# Patient Record
Sex: Female | Born: 1985 | Race: Black or African American | Hispanic: Refuse to answer | Smoking: Former smoker
Health system: Southern US, Community
[De-identification: ages and names within clinical notes are randomized; demographics above are authoritative.]

## PROBLEM LIST (undated history)

## (undated) DIAGNOSIS — R202 Paresthesia of skin: Secondary | ICD-10-CM

## (undated) DIAGNOSIS — G9050 Complex regional pain syndrome I, unspecified: Secondary | ICD-10-CM

## (undated) DIAGNOSIS — B2790 Infectious mononucleosis, unspecified without complication: Secondary | ICD-10-CM

## (undated) DIAGNOSIS — L52 Erythema nodosum: Secondary | ICD-10-CM

## (undated) DIAGNOSIS — F32A Depression, unspecified: Secondary | ICD-10-CM

## (undated) DIAGNOSIS — E049 Nontoxic goiter, unspecified: Secondary | ICD-10-CM

## (undated) DIAGNOSIS — J302 Other seasonal allergic rhinitis: Secondary | ICD-10-CM

## (undated) DIAGNOSIS — F329 Major depressive disorder, single episode, unspecified: Secondary | ICD-10-CM

## (undated) DIAGNOSIS — F419 Anxiety disorder, unspecified: Secondary | ICD-10-CM

## (undated) DIAGNOSIS — S92909A Unspecified fracture of unspecified foot, initial encounter for closed fracture: Secondary | ICD-10-CM

## (undated) DIAGNOSIS — K649 Unspecified hemorrhoids: Secondary | ICD-10-CM

## (undated) DIAGNOSIS — E7800 Pure hypercholesterolemia, unspecified: Secondary | ICD-10-CM

## (undated) DIAGNOSIS — G43909 Migraine, unspecified, not intractable, without status migrainosus: Secondary | ICD-10-CM

## (undated) DIAGNOSIS — N809 Endometriosis, unspecified: Secondary | ICD-10-CM

## (undated) DIAGNOSIS — R400 Somnolence: Secondary | ICD-10-CM

## (undated) DIAGNOSIS — D649 Anemia, unspecified: Secondary | ICD-10-CM

## (undated) DIAGNOSIS — K2970 Gastritis, unspecified, without bleeding: Secondary | ICD-10-CM

## (undated) HISTORY — DX: Complex regional pain syndrome I, unspecified: G90.50

## (undated) HISTORY — DX: Nontoxic goiter, unspecified: E04.9

## (undated) HISTORY — DX: Unspecified hemorrhoids: K64.9

## (undated) HISTORY — PX: KNEE SURGERY: SHX244

## (undated) HISTORY — PX: WISDOM TOOTH EXTRACTION: SHX21

## (undated) HISTORY — PX: CRYOTHERAPY: SHX1416

## (undated) HISTORY — DX: Gastritis, unspecified, without bleeding: K29.70

## (undated) HISTORY — PX: MOUTH SURGERY: SHX715

## (undated) HISTORY — DX: Paresthesia of skin: R20.2

## (undated) HISTORY — DX: Pure hypercholesterolemia, unspecified: E78.00

## (undated) HISTORY — DX: Endometriosis, unspecified: N80.9

## (undated) HISTORY — DX: Somnolence: R40.0

## (undated) HISTORY — DX: Migraine, unspecified, not intractable, without status migrainosus: G43.909

---
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
## 2007-06-26 ENCOUNTER — Encounter: Payer: Self-pay | Admitting: Pulmonary Disease

## 2007-12-12 ENCOUNTER — Encounter: Payer: Self-pay | Admitting: Pulmonary Disease

## 2008-05-08 ENCOUNTER — Encounter: Payer: Self-pay | Admitting: Pulmonary Disease

## 2008-09-19 ENCOUNTER — Encounter: Payer: Self-pay | Admitting: Pulmonary Disease

## 2009-04-30 ENCOUNTER — Ambulatory Visit: Payer: Self-pay | Admitting: Pulmonary Disease

## 2009-04-30 DIAGNOSIS — J45909 Unspecified asthma, uncomplicated: Secondary | ICD-10-CM | POA: Insufficient documentation

## 2009-05-01 DIAGNOSIS — L52 Erythema nodosum: Secondary | ICD-10-CM

## 2009-05-07 ENCOUNTER — Ambulatory Visit: Payer: Self-pay | Admitting: Internal Medicine

## 2010-09-04 ENCOUNTER — Emergency Department (HOSPITAL_COMMUNITY): Admission: EM | Admit: 2010-09-04 | Discharge: 2010-09-04 | Payer: Self-pay | Admitting: Family Medicine

## 2011-08-20 ENCOUNTER — Inpatient Hospital Stay (HOSPITAL_COMMUNITY)
Admission: AD | Admit: 2011-08-20 | Discharge: 2011-08-20 | Disposition: A | Discharge status: Home / Self Care | Payer: 59 | Source: Ambulatory Visit | Attending: Obstetrics and Gynecology | Admitting: Obstetrics and Gynecology

## 2011-08-20 ENCOUNTER — Encounter (HOSPITAL_COMMUNITY): Payer: Self-pay | Admitting: *Deleted

## 2011-08-20 ENCOUNTER — Inpatient Hospital Stay (HOSPITAL_COMMUNITY): Payer: 59

## 2011-08-20 DIAGNOSIS — N938 Other specified abnormal uterine and vaginal bleeding: Secondary | ICD-10-CM | POA: Insufficient documentation

## 2011-08-20 DIAGNOSIS — R1032 Left lower quadrant pain: Secondary | ICD-10-CM | POA: Insufficient documentation

## 2011-08-20 DIAGNOSIS — N939 Abnormal uterine and vaginal bleeding, unspecified: Secondary | ICD-10-CM | POA: Diagnosis present

## 2011-08-20 DIAGNOSIS — N898 Other specified noninflammatory disorders of vagina: Secondary | ICD-10-CM

## 2011-08-20 DIAGNOSIS — R102 Pelvic and perineal pain: Secondary | ICD-10-CM | POA: Diagnosis present

## 2011-08-20 DIAGNOSIS — N949 Unspecified condition associated with female genital organs and menstrual cycle: Secondary | ICD-10-CM | POA: Insufficient documentation

## 2011-08-20 HISTORY — DX: Other seasonal allergic rhinitis: J30.2

## 2011-08-20 HISTORY — DX: Anemia, unspecified: D64.9

## 2011-08-20 HISTORY — DX: Erythema nodosum: L52

## 2011-08-20 LAB — URINALYSIS, ROUTINE W REFLEX MICROSCOPIC
Bilirubin Urine: NEGATIVE
Ketones, ur: NEGATIVE mg/dL
Nitrite: NEGATIVE
pH: 7 (ref 5.0–8.0)

## 2011-08-20 LAB — URINE MICROSCOPIC-ADD ON

## 2011-08-20 MED ORDER — KETOROLAC TROMETHAMINE 60 MG/2ML IM SOLN
60.0000 mg | Freq: Once | INTRAMUSCULAR | Status: AC
  Administered 2011-08-20: 60 mg via INTRAMUSCULAR
  Filled 2011-08-20: qty 2

## 2011-08-20 MED ORDER — IBUPROFEN 200 MG PO TABS: 800.0000 mg | ORAL_TABLET | Freq: Three times a day (TID) | ORAL | Status: DC | PRN

## 2011-08-20 NOTE — ED Provider Notes (Signed)
History     Chief Complaint  Patient presents with  . Vaginal Bleeding  . Abdominal Cramping  . Ovarian Cyst   HPI "Heavy" vaginal bleeding since yesterday. LLQ cramping. Pain in intercourse on 9/11. Seen in office yesterday for this complaint, had u/s, was told that it was probably an ovarian cyst. Pain is worsening.   OB History    Grav Para Term Preterm Abortions TAB SAB Ect Mult Living   0               Past Medical History  Diagnosis Date  . Asthma   . Seasonal allergic rhinitis   . Anemia   . Erythema nodosum     on legs    Past Surgical History  Procedure Date  . Knee surgery   . Mouth surgery     Family History  Problem Relation Age of Onset  . Hypertension Mother   . Thyroid disease Mother     History  Substance Use Topics  . Smoking status: Former Smoker    Quit date: 12/07/2003  . Smokeless tobacco: Not on file  . Alcohol Use: Yes     pint a week on the weekends    Allergies: No Known Allergies  Prescriptions prior to admission  Medication Sig Dispense Refill  . Chlorpheniramine Maleate (ALLERGY PO) Take 1 tablet by mouth daily.        Marland Kitchen HYDROcodone-acetaminophen (VICODIN) 5-500 MG per tablet Take 1 tablet by mouth every 6 (six) hours as needed. For pain       . DISCONTD: ibuprofen (ADVIL,MOTRIN) 200 MG tablet Take 600 mg by mouth every 6 (six) hours as needed. For pain         Review of Systems  Constitutional: Negative.   Respiratory: Negative.   Cardiovascular: Negative.   Gastrointestinal: Positive for abdominal pain. Negative for nausea, vomiting, diarrhea and constipation.       + pain with BM  Genitourinary: Negative for dysuria, urgency, frequency, hematuria and flank pain.       Positive for vaginal bleeding and cramping   Musculoskeletal: Negative.   Neurological: Negative.   Psychiatric/Behavioral: Negative.    Physical Exam   Blood pressure 117/81, pulse 83, temperature 98.2 F (36.8 C), temperature source Oral, resp.  rate 16, height 5\' 7"  (1.702 m), weight 80.105 kg (176 lb 9.6 oz), last menstrual period 07/26/2011.  Physical Exam  Constitutional: She is oriented to person, place, and time. She appears well-developed and well-nourished. No distress.  HENT:  Head: Normocephalic and atraumatic.  Cardiovascular: Normal rate, regular rhythm and normal heart sounds.   Respiratory: Effort normal and breath sounds normal. No respiratory distress.  GI: Soft. Bowel sounds are normal. She exhibits no distension and no mass. There is no tenderness. There is no rebound and no guarding.  Genitourinary: There is no rash or lesion on the right labia. There is no rash or lesion on the left labia. Uterus is not deviated, not enlarged, not fixed and not tender. Cervix exhibits motion tenderness. Cervix exhibits no discharge and no friability. Right adnexum displays no mass, no tenderness and no fullness. Left adnexum displays tenderness. Left adnexum displays no mass and no fullness. There is bleeding (moderate) around the vagina. No erythema or tenderness around the vagina. No vaginal discharge found.  Neurological: She is alert and oriented to person, place, and time.  Skin: Skin is warm and dry.  Psychiatric: She has a normal mood and affect.    MAU Course  Procedures Pelvic u/s WNL, no adnexal mass, trace free fluid  Results for orders placed during the hospital encounter of 08/20/11 (from the past 24 hour(s))  URINALYSIS, ROUTINE W REFLEX MICROSCOPIC     Status: Abnormal   Collection Time   08/20/11  5:05 AM      Component Value Range   Color, Urine YELLOW  YELLOW    Appearance CLEAR  CLEAR    Specific Gravity, Urine 1.010  1.005 - 1.030    pH 7.0  5.0 - 8.0    Glucose, UA NEGATIVE  NEGATIVE (mg/dL)   Hgb urine dipstick LARGE (*) NEGATIVE    Bilirubin Urine NEGATIVE  NEGATIVE    Ketones, ur NEGATIVE  NEGATIVE (mg/dL)   Protein, ur NEGATIVE  NEGATIVE (mg/dL)   Urobilinogen, UA 0.2  0.0 - 1.0 (mg/dL)   Nitrite  NEGATIVE  NEGATIVE    Leukocytes, UA NEGATIVE  NEGATIVE   URINE MICROSCOPIC-ADD ON     Status: Abnormal   Collection Time   08/20/11  5:05 AM      Component Value Range   Squamous Epithelial / LPF RARE  RARE    WBC, UA 0-2  <3 (WBC/hpf)   RBC / HPF 21-50  <3 (RBC/hpf)   Bacteria, UA FEW (*) RARE   POCT PREGNANCY, URINE     Status: Normal   Collection Time   08/20/11  5:16 AM      Component Value Range   Preg Test, Ur NEGATIVE       Assessment and Plan  Pelvic pain and vaginal bleeding Ibuprofen for pain F/U in office next week as scheduled Dr. Neva Seat consulted, agrees with plan Lillie Portner 08/20/2011, 7:04 AM

## 2011-08-20 NOTE — Progress Notes (Signed)
Pt having vag bleeding and LLQ cramping.  LMP 07/26/2011.  Pt seen in the office 9/13.

## 2012-02-01 ENCOUNTER — Other Ambulatory Visit (HOSPITAL_COMMUNITY)
Admission: RE | Admit: 2012-02-01 | Discharge: 2012-02-01 | Disposition: A | Discharge status: Home / Self Care | Payer: 59 | Source: Ambulatory Visit | Attending: Obstetrics and Gynecology | Admitting: Obstetrics and Gynecology

## 2012-02-01 DIAGNOSIS — Z124 Encounter for screening for malignant neoplasm of cervix: Secondary | ICD-10-CM | POA: Insufficient documentation

## 2012-02-01 DIAGNOSIS — Z113 Encounter for screening for infections with a predominantly sexual mode of transmission: Secondary | ICD-10-CM | POA: Insufficient documentation

## 2012-11-12 IMAGING — US US PELVIS COMPLETE
1 series · 13 of 25 positions shown · non-contrast
Comparison: None.

CLINICAL DATA: Vaginal bleeding and left adnexal pain.  Cramping.

TRANSABDOMINAL AND TRANSVAGINAL ULTRASOUND OF PELVIS
TECHNIQUE: Both transabdominal and transvaginal ultrasound
examinations of the pelvis were performed. Transabdominal technique
was performed for global imaging of the pelvis including uterus,
ovaries, adnexal regions, and pelvic cul-de-sac.

[Series 1: us pelvis complete · 13 of 41 slices shown]
[im 1/41]
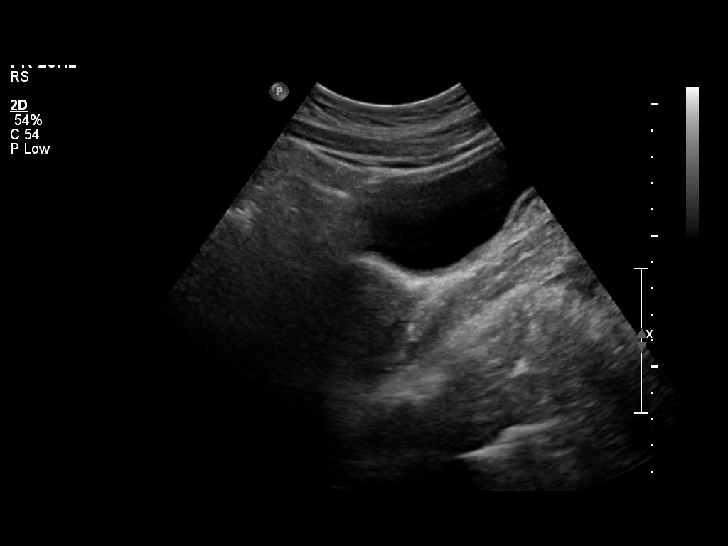
[im 4/41]
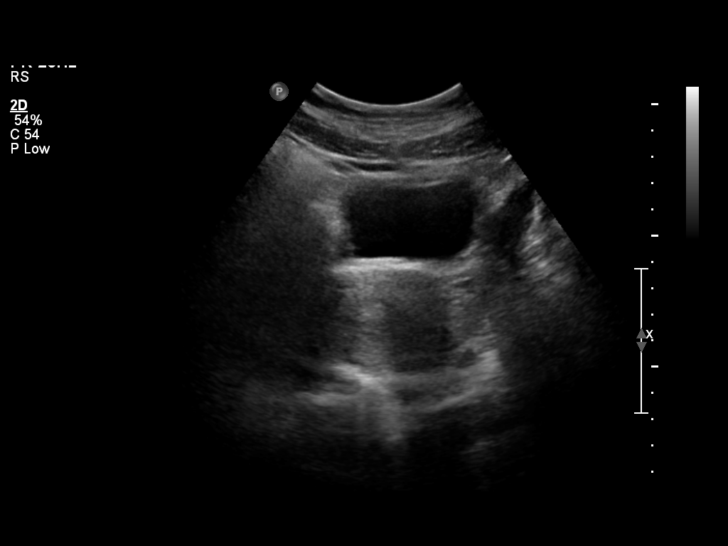
[im 7/41]
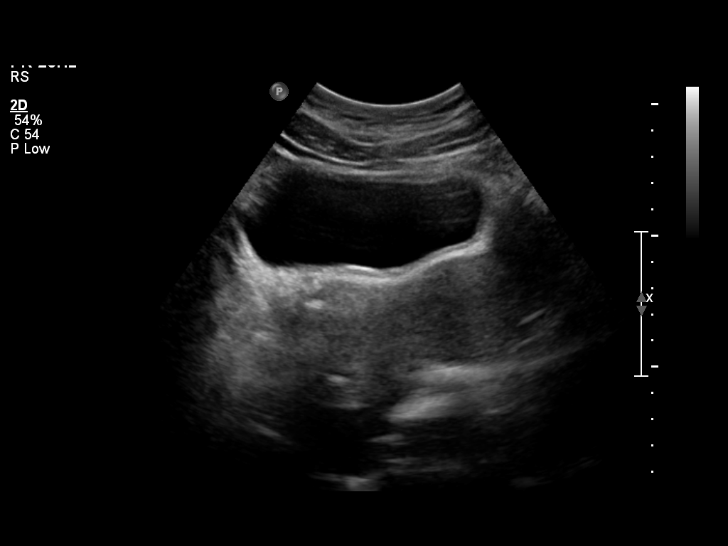
[im 11/41]
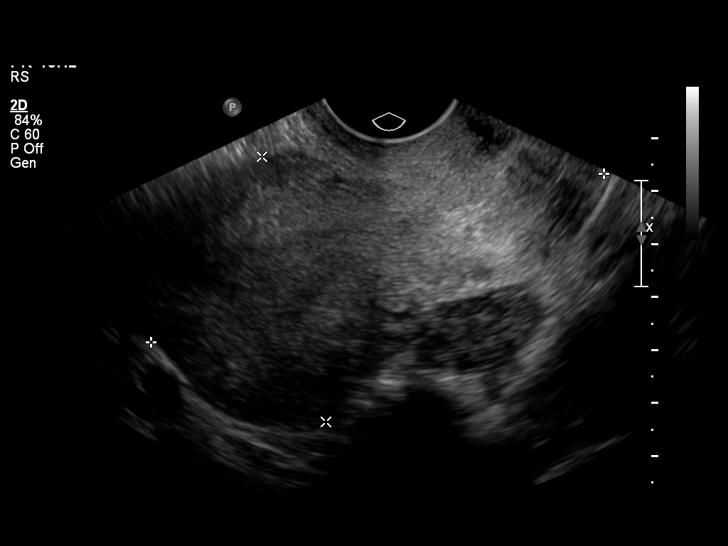
[im 14/41]
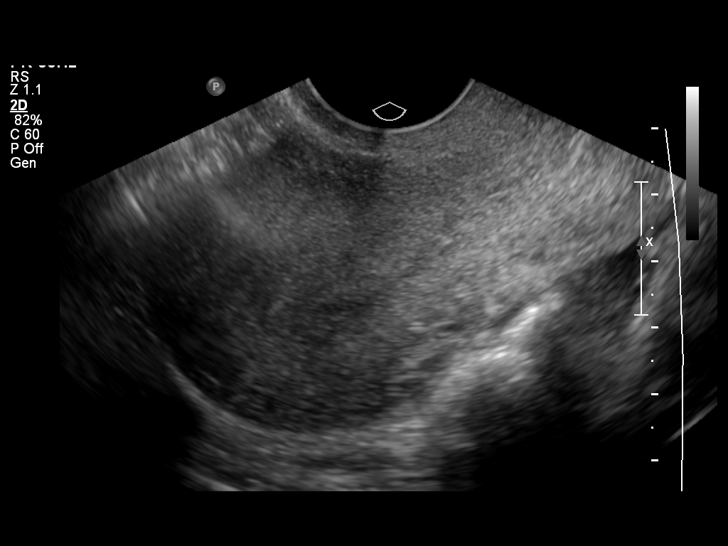
[im 17/41]
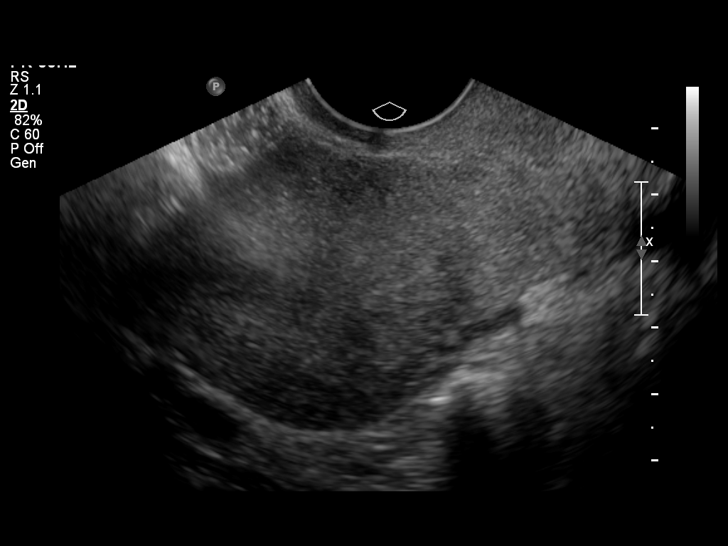
[im 21/41]
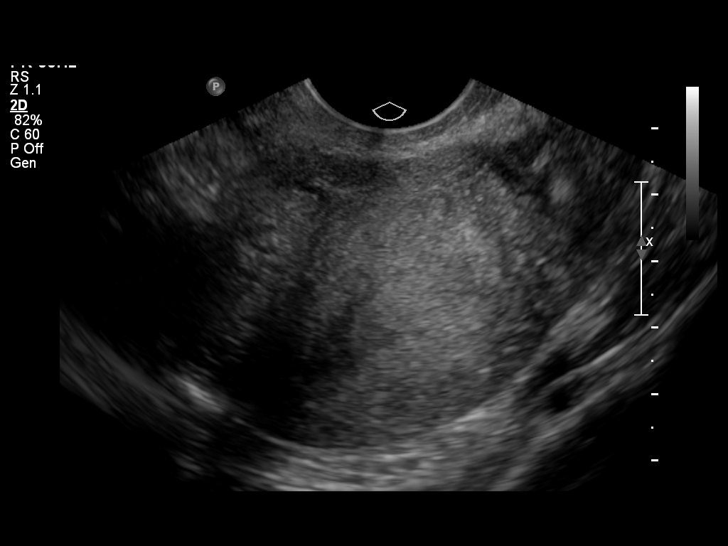
[im 24/41]
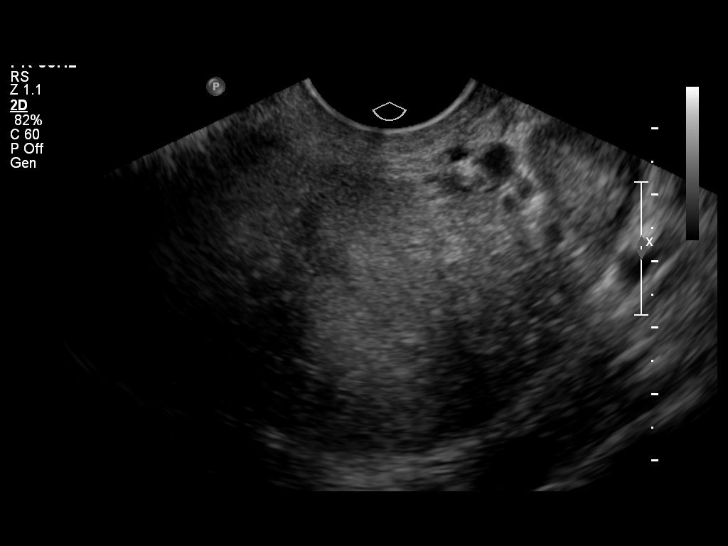
[im 27/41]
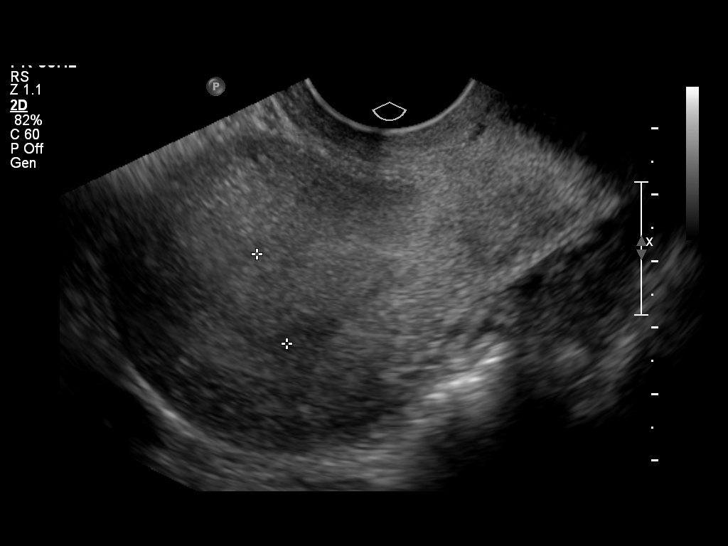
[im 31/41]
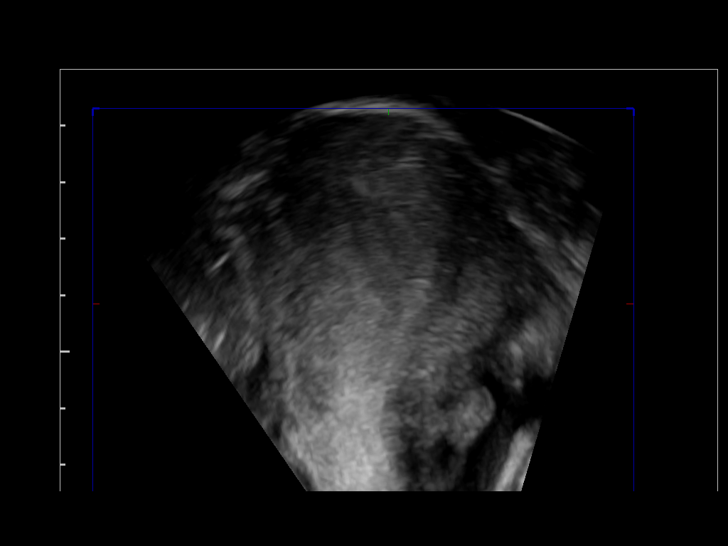
[im 34/41]
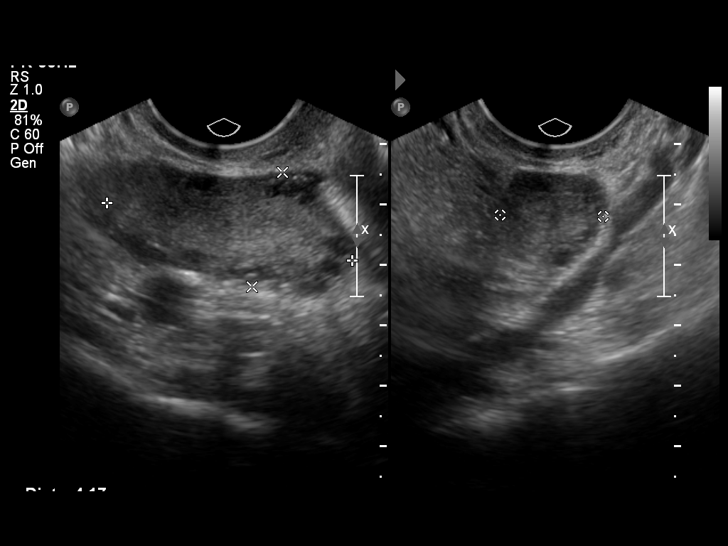
[im 37/41]
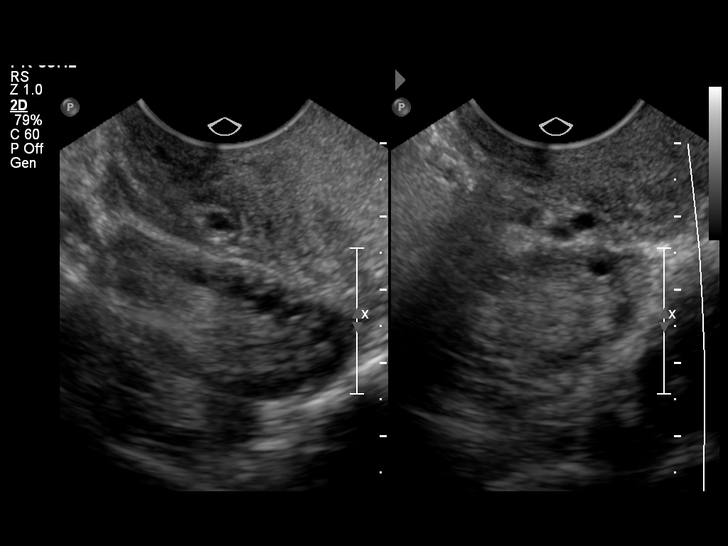
[im 41/41]
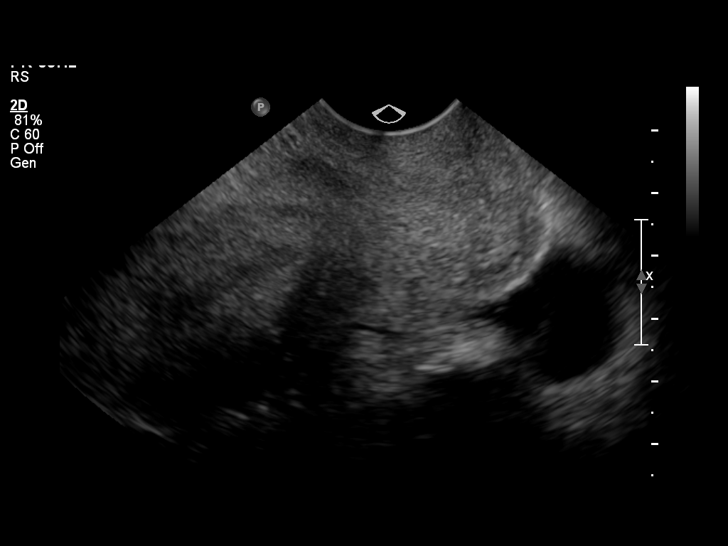

[13 of 25 positions shown; findings below may reference images not displayed]

It was necessary to proceed with endovaginal exam following the
transabdominal exam to visualize the ovaries.
FINDINGS: Uterus: Normal in size and appearance; measures 9.1 cm in length,
5.2 cm in AP dimension and 5.6 cm in transverse dimension.

Endometrium: Measures 1.5 cm in thickness, likely reflecting the
secretory phase.

Right ovary:  Normal appearance/no adnexal mass; measures 3.7 x
x 1.9 cm.  Limited color Doppler evaluation demonstrates normal
color Doppler blood flow with respect to the right ovary.

Left ovary: Normal appearance/no adnexal mass; measures 4.2 x 2.0 x
1.7 cm.  Limited color Doppler evaluation demonstrates normal color
Doppler blood flow with respect to the left ovary.

Other findings: A small amount of free fluid is noted within the
pelvic cul-de-sac, likely physiologic in nature.
IMPRESSION: Normal study. No evidence of pelvic mass or other significant
abnormality.

## 2013-05-23 ENCOUNTER — Other Ambulatory Visit: Payer: Self-pay | Admitting: Family Medicine

## 2013-05-23 DIAGNOSIS — M79671 Pain in right foot: Secondary | ICD-10-CM

## 2013-06-06 ENCOUNTER — Other Ambulatory Visit: Payer: 59

## 2013-06-13 ENCOUNTER — Other Ambulatory Visit: Payer: 59

## 2013-08-05 ENCOUNTER — Encounter (HOSPITAL_BASED_OUTPATIENT_CLINIC_OR_DEPARTMENT_OTHER): Payer: Self-pay | Admitting: Emergency Medicine

## 2013-08-05 ENCOUNTER — Emergency Department (HOSPITAL_BASED_OUTPATIENT_CLINIC_OR_DEPARTMENT_OTHER)
Admission: EM | Admit: 2013-08-05 | Discharge: 2013-08-05 | Disposition: A | Discharge status: Home / Self Care | Payer: 59 | Attending: Emergency Medicine | Admitting: Emergency Medicine

## 2013-08-05 DIAGNOSIS — L0501 Pilonidal cyst with abscess: Secondary | ICD-10-CM | POA: Insufficient documentation

## 2013-08-05 DIAGNOSIS — Z8781 Personal history of (healed) traumatic fracture: Secondary | ICD-10-CM | POA: Insufficient documentation

## 2013-08-05 DIAGNOSIS — J45909 Unspecified asthma, uncomplicated: Secondary | ICD-10-CM | POA: Insufficient documentation

## 2013-08-05 DIAGNOSIS — Z8709 Personal history of other diseases of the respiratory system: Secondary | ICD-10-CM | POA: Insufficient documentation

## 2013-08-05 DIAGNOSIS — Z862 Personal history of diseases of the blood and blood-forming organs and certain disorders involving the immune mechanism: Secondary | ICD-10-CM | POA: Insufficient documentation

## 2013-08-05 DIAGNOSIS — Z872 Personal history of diseases of the skin and subcutaneous tissue: Secondary | ICD-10-CM | POA: Insufficient documentation

## 2013-08-05 DIAGNOSIS — Z87891 Personal history of nicotine dependence: Secondary | ICD-10-CM | POA: Insufficient documentation

## 2013-08-05 DIAGNOSIS — Z79899 Other long term (current) drug therapy: Secondary | ICD-10-CM | POA: Insufficient documentation

## 2013-08-05 HISTORY — DX: Unspecified fracture of unspecified foot, initial encounter for closed fracture: S92.909A

## 2013-08-05 MED ORDER — ONDANSETRON 4 MG PO TBDP
4.0000 mg | ORAL_TABLET | Freq: Once | ORAL | Status: AC
  Administered 2013-08-05: 4 mg via ORAL

## 2013-08-05 MED ORDER — ONDANSETRON 4 MG PO TBDP
ORAL_TABLET | ORAL | Status: AC
  Administered 2013-08-05: 4 mg via ORAL
  Filled 2013-08-05: qty 1

## 2013-08-05 MED ORDER — OXYCODONE-ACETAMINOPHEN 5-325 MG PO TABS
2.0000 | ORAL_TABLET | Freq: Once | ORAL | Status: AC
  Administered 2013-08-05: 2 via ORAL
  Filled 2013-08-05 (×2): qty 2

## 2013-08-05 MED ORDER — OXYCODONE-ACETAMINOPHEN 5-325 MG PO TABS: 1.0000 | ORAL_TABLET | ORAL | Status: DC | PRN

## 2013-08-05 MED ORDER — SULFAMETHOXAZOLE-TRIMETHOPRIM 800-160 MG PO TABS: 1.0000 | ORAL_TABLET | Freq: Two times a day (BID) | ORAL | Status: DC

## 2013-08-05 NOTE — ED Notes (Signed)
D/c home with ride- rx x 2 given for percocet and septra- pt states she feels better, nausea is decreased

## 2013-08-05 NOTE — ED Provider Notes (Signed)
This chart was scribed for Layla Maw Sanvi Ehler, DO by Leone Payor, ED Scribe. This patient was seen in room MH06/[facility]06 and the patient's care was started 5:36 PM.  TIME SEEN: 5:36 PM   CHIEF COMPLAINT: back pain  HPI: HPI Comments: Brenda Mitchell is a 27 y.o. female who presents to the Emergency Department complaining of ongoing, constant, gradually worsening lower back pain starting 1 week ago. Pt states she has felt a mass above the tail bone that is very painful to touch. She denies a history of Crohn's disease or ulcerative colitis. No pain with bowel movements. No history of prior abscess.  She denies fever, vomiting, diarrhea.    ROS: See HPI Constitutional: no fever  Eyes: no drainage  ENT: no runny nose   Cardiovascular:  no chest pain  Resp: no SOB  GI: no vomiting, diarrhea GU: no dysuria Integumentary: no rash  Allergy: no hives  Musculoskeletal: no leg swelling  Neurological: no slurred speech ROS otherwise negative  PAST MEDICAL HISTORY/PAST SURGICAL HISTORY:  Past Medical History  Diagnosis Date  . Asthma   . Seasonal allergic rhinitis   . Anemia   . Erythema nodosum     on legs  . Fracture of foot, closed     MEDICATIONS:  Prior to Admission medications   Medication Sig Start Date End Date Taking? Authorizing Provider  Chlorpheniramine Maleate (ALLERGY PO) Take 1 tablet by mouth daily.      Historical Provider, MD  HYDROcodone-acetaminophen (VICODIN) 5-500 MG per tablet Take 1 tablet by mouth every 6 (six) hours as needed. For pain     Historical Provider, MD  ibuprofen (ADVIL,MOTRIN) 200 MG tablet Take 4 tablets (800 mg total) by mouth every 8 (eight) hours as needed for pain. For pain 08/20/11   Archie Patten, CNM    ALLERGIES:  No Known Allergies  SOCIAL HISTORY:  History  Substance Use Topics  . Smoking status: Former Smoker    Quit date: 12/07/2003  . Smokeless tobacco: Not on file  . Alcohol Use: Yes     Comment: pint a week on the weekends     FAMILY HISTORY: Family History  Problem Relation Age of Onset  . Hypertension Mother   . Thyroid disease Mother     EXAM: BP 121/65  Pulse 96  Temp(Src) 98.4 F (36.9 C)  Resp 16  SpO2 100% CONSTITUTIONAL: Alert and oriented and responds appropriately to questions. Well-appearing; well-nourished HEAD: Normocephalic EYES: Conjunctivae clear, PERRL ENT: normal nose; no rhinorrhea; moist mucous membranes; pharynx without lesions noted NECK: Supple, no meningismus, no LAD  CARD: RRR; S1 and S2 appreciated; no murmurs, no clicks, no rubs, no gallops RESP: Normal chest excursion without splinting or tachypnea; breath sounds clear and equal bilaterally; no wheezes, no rhonchi, no rales,  ABD/GI: Normal bowel sounds; non-distended; soft, non-tender, no rebound, no guarding BACK:  The back appears normal and is non-tender to palpation, there is no CVA tenderness EXT: Normal ROM in all joints; non-tender to palpation; no edema; normal capillary refill; no cyanosis    SKIN: Normal color for age and race; warm. 5x3 area at the top of glueal cleft. Surrounding erythema and induration with central fluctuant portion with pinpoint area of purulent drainage on the center. NEURO: Moves all extremities equally PSYCH: The patient's mood and manner are appropriate. Grooming and personal hygiene are appropriate.  MEDICAL DECISION MAKING: Pt present with pilonidal cyst. Will perform I&D. She is well appearing with no systemic symptoms. Will  prescribe pain medication and antibiotics. Advised to follow up in 48 hours for reevaluation. Given customary in usual return precautions. She has PCP for followup. Patient verbalizes understanding is comfortable with plan. .    7:30 PM  INCISION AND DRAINAGE Performed by: Zettie Cooley, DO Consent: Verbal consent obtained. Risks and benefits: risks, benefits and alternatives were discussed Type: abscess  Body area: buttocks  Anesthesia: local  infiltration  Incision was made with a scalpel.  Local anesthetic: lidocaine 1% with epinephrine  Anesthetic total: 10 ml  Complexity: complex Blunt dissection to break up loculations  Drainage: purulent  Drainage amount: 10 Ml  Packing material: 1/4 in iodoform gauze - 8 cm used   Patient tolerance: Patient tolerated the procedure well with no immediate complications.     I personally performed the services described in this documentation, which was scribed in my presence. The recorded information has been reviewed and is accurate.  Layla Maw Dreyah Montrose, DO 08/06/13 [phone]

## 2013-08-05 NOTE — ED Notes (Signed)
States back pain x 1 week. 2 days ago noticed lump to top of tail bone area. No drainage noted, but painful to touch. Taking ibuprofen OTC, with mild relief. Pain 8/10. Has some tingling in bilateral legs after standing for a while.

## 2013-08-05 NOTE — ED Notes (Signed)
Lower back pain x 1 week, 2 days ago feels lump above tail bone that is painful

## 2013-08-05 NOTE — ED Notes (Signed)
Pt continues to c/o nausea after zofran administered- given crackers and gingerale

## 2013-08-07 ENCOUNTER — Emergency Department (HOSPITAL_BASED_OUTPATIENT_CLINIC_OR_DEPARTMENT_OTHER)
Admission: EM | Admit: 2013-08-07 | Discharge: 2013-08-07 | Disposition: A | Discharge status: Home / Self Care | Payer: 59 | Attending: Emergency Medicine | Admitting: Emergency Medicine

## 2013-08-07 ENCOUNTER — Encounter (HOSPITAL_BASED_OUTPATIENT_CLINIC_OR_DEPARTMENT_OTHER): Payer: Self-pay

## 2013-08-07 DIAGNOSIS — Z4801 Encounter for change or removal of surgical wound dressing: Secondary | ICD-10-CM | POA: Insufficient documentation

## 2013-08-07 DIAGNOSIS — Z5189 Encounter for other specified aftercare: Secondary | ICD-10-CM

## 2013-08-07 DIAGNOSIS — Z8781 Personal history of (healed) traumatic fracture: Secondary | ICD-10-CM | POA: Insufficient documentation

## 2013-08-07 DIAGNOSIS — Z79899 Other long term (current) drug therapy: Secondary | ICD-10-CM | POA: Insufficient documentation

## 2013-08-07 DIAGNOSIS — Z862 Personal history of diseases of the blood and blood-forming organs and certain disorders involving the immune mechanism: Secondary | ICD-10-CM | POA: Insufficient documentation

## 2013-08-07 DIAGNOSIS — Z87891 Personal history of nicotine dependence: Secondary | ICD-10-CM | POA: Insufficient documentation

## 2013-08-07 DIAGNOSIS — Z872 Personal history of diseases of the skin and subcutaneous tissue: Secondary | ICD-10-CM | POA: Insufficient documentation

## 2013-08-07 DIAGNOSIS — J45909 Unspecified asthma, uncomplicated: Secondary | ICD-10-CM | POA: Insufficient documentation

## 2013-08-07 NOTE — ED Provider Notes (Signed)
CSN: [id number]     Arrival date & time 08/07/13  1253 History   First MD Initiated Contact with Patient 08/07/13 1319     Chief Complaint  Patient presents with  . Wound Check   (Consider location/radiation/quality/duration/timing/severity/associated sxs/prior Treatment) Patient is a 27 y.o. female presenting with wound check.  Wound Check This is a new problem. Episode onset: I/D performed on 08/05/13. The problem occurs constantly. The problem has been gradually improving. Pertinent negatives include no chest pain, no abdominal pain, no headaches and no shortness of breath. Nothing aggravates the symptoms. Nothing relieves the symptoms. She has tried nothing for the symptoms. The treatment provided significant relief.    Past Medical History  Diagnosis Date  . Asthma   . Seasonal allergic rhinitis   . Anemia   . Erythema nodosum     on legs  . Fracture of foot, closed    Past Surgical History  Procedure Laterality Date  . Knee surgery    . Mouth surgery     Family History  Problem Relation Age of Onset  . Hypertension Mother   . Thyroid disease Mother    History  Substance Use Topics  . Smoking status: Former Smoker    Quit date: 12/07/2003  . Smokeless tobacco: Not on file  . Alcohol Use: Yes     Comment: pint a week on the weekends   OB History   Grav Para Term Preterm Abortions TAB SAB Ect Mult Living   0              Review of Systems  Constitutional: Negative for fever and fatigue.  HENT: Negative for congestion, drooling and neck pain.   Eyes: Negative for pain.  Respiratory: Negative for cough and shortness of breath.   Cardiovascular: Negative for chest pain.  Gastrointestinal: Negative for nausea, vomiting, abdominal pain and diarrhea.  Genitourinary: Negative for dysuria and hematuria.  Musculoskeletal: Negative for back pain and gait problem.  Skin: Negative for color change.  Neurological: Negative for dizziness and headaches.  Hematological:  Negative for adenopathy.  Psychiatric/Behavioral: Negative for behavioral problems.  All other systems reviewed and are negative.    Allergies  Review of patient's allergies indicates no known allergies.  Home Medications   Current Outpatient Rx  Name  Route  Sig  Dispense  Refill  . Chlorpheniramine Maleate (ALLERGY PO)   Oral   Take 1 tablet by mouth daily.           Marland Kitchen HYDROcodone-acetaminophen (VICODIN) 5-500 MG per tablet   Oral   Take 1 tablet by mouth every 6 (six) hours as needed. For pain          . ibuprofen (ADVIL,MOTRIN) 200 MG tablet   Oral   Take 4 tablets (800 mg total) by mouth every 8 (eight) hours as needed for pain. For pain   30 tablet   1   . oxyCODONE-acetaminophen (PERCOCET/ROXICET) 5-325 MG per tablet   Oral   Take 1 tablet by mouth every 4 (four) hours as needed for pain.   15 tablet   0   . sulfamethoxazole-trimethoprim (SEPTRA DS) 800-160 MG per tablet   Oral   Take 1 tablet by mouth every 12 (twelve) hours.   14 tablet   0    BP 128/84  Pulse 105  Temp(Src) 98 F (36.7 C) (Oral)  Resp 18  Ht 5\' 6"  (1.676 m)  Wt 160 lb (72.576 kg)  BMI 25.84 kg/m2  SpO2 98%  Physical Exam  Nursing note and vitals reviewed. Constitutional: She is oriented to person, place, and time. She appears well-developed and well-nourished.  HENT:  Head: Normocephalic.  Mouth/Throat: No oropharyngeal exudate.  Eyes: Conjunctivae and EOM are normal. Pupils are equal, round, and reactive to light.  Neck: Normal range of motion. Neck supple.  Cardiovascular: Normal rate, regular rhythm, normal heart sounds and intact distal pulses.  Exam reveals no gallop and no friction rub.   No murmur heard. Pulmonary/Chest: Effort normal and breath sounds normal. No respiratory distress. She has no wheezes.  Abdominal: Soft. Bowel sounds are normal. There is no tenderness. There is no rebound and no guarding.  Musculoskeletal: Normal range of motion. She exhibits no edema  and no tenderness.  1cm incision at the upper gluteal cleft. Mild area of induration surrounding the wound, approx 3x3cm. Wound appears clean, dry. No purulence expressed w/ palpation. Area continues to have mild to moderate ttp. No erythema noted.   Neurological: She is alert and oriented to person, place, and time.  Skin: Skin is warm and dry.  Psychiatric: She has a normal mood and affect. Her behavior is normal.    ED Course  Procedures (including critical care time) Labs Review Labs Reviewed - No data to display Imaging Review No results found.  MDM   1. Wound check, abscess    1:36 PM 27 y.o. female seen here on 08/05/13 for I/D of pilonidal cyst. Pt returns for wound check. Has been well otherwise, no fevers, improving pain. Mild bloody drainage. Will removing packing. Recommend return for any worsening.   1:36 PM:  I have discussed the diagnosis/risks/treatment options with the patient and believe the pt to be eligible for discharge home to follow-up with pcp as needed. We also discussed returning to the ED immediately if new or worsening sx occur. We discussed the sx which are most concerning (e.g., inc swelling, spreading redness, fever, worsening pain) that necessitate immediate return. Any new prescriptions provided to the patient are listed below.  New Prescriptions   No medications on file       Junius Argyle, MD 08/07/13 1350

## 2013-08-07 NOTE — ED Notes (Signed)
Here for wound recheck and packing removal from buttock.

## 2014-04-26 ENCOUNTER — Other Ambulatory Visit: Payer: Self-pay | Admitting: Orthopedic Surgery

## 2014-04-26 DIAGNOSIS — M79671 Pain in right foot: Secondary | ICD-10-CM

## 2014-04-27 ENCOUNTER — Ambulatory Visit
Admission: RE | Admit: 2014-04-27 | Discharge: 2014-04-27 | Disposition: A | Discharge status: Home / Self Care | Payer: 59 | Source: Ambulatory Visit | Attending: Orthopedic Surgery | Admitting: Orthopedic Surgery

## 2014-04-27 DIAGNOSIS — M79671 Pain in right foot: Secondary | ICD-10-CM

## 2015-05-15 NOTE — Telephone Encounter (Signed)
This encounter was created in error - please disregard.

## 2016-12-06 DIAGNOSIS — B2790 Infectious mononucleosis, unspecified without complication: Secondary | ICD-10-CM

## 2016-12-06 HISTORY — DX: Infectious mononucleosis, unspecified without complication: B27.90

## 2017-01-25 ENCOUNTER — Emergency Department (HOSPITAL_BASED_OUTPATIENT_CLINIC_OR_DEPARTMENT_OTHER): Payer: BLUE CROSS/BLUE SHIELD

## 2017-01-25 ENCOUNTER — Encounter (HOSPITAL_BASED_OUTPATIENT_CLINIC_OR_DEPARTMENT_OTHER): Payer: Self-pay

## 2017-01-25 ENCOUNTER — Emergency Department (HOSPITAL_BASED_OUTPATIENT_CLINIC_OR_DEPARTMENT_OTHER)
Admission: EM | Admit: 2017-01-25 | Discharge: 2017-01-25 | Disposition: A | Discharge status: Home / Self Care | Payer: BLUE CROSS/BLUE SHIELD | Attending: Emergency Medicine | Admitting: Emergency Medicine

## 2017-01-25 DIAGNOSIS — J45909 Unspecified asthma, uncomplicated: Secondary | ICD-10-CM | POA: Insufficient documentation

## 2017-01-25 DIAGNOSIS — Z87891 Personal history of nicotine dependence: Secondary | ICD-10-CM | POA: Insufficient documentation

## 2017-01-25 DIAGNOSIS — R109 Unspecified abdominal pain: Secondary | ICD-10-CM

## 2017-01-25 DIAGNOSIS — Z79899 Other long term (current) drug therapy: Secondary | ICD-10-CM | POA: Insufficient documentation

## 2017-01-25 DIAGNOSIS — B2790 Infectious mononucleosis, unspecified without complication: Secondary | ICD-10-CM

## 2017-01-25 DIAGNOSIS — R1012 Left upper quadrant pain: Secondary | ICD-10-CM | POA: Diagnosis present

## 2017-01-25 HISTORY — DX: Complex regional pain syndrome I, unspecified: G90.50

## 2017-01-25 HISTORY — DX: Infectious mononucleosis, unspecified without complication: B27.90

## 2017-01-25 LAB — CBC WITH DIFFERENTIAL/PLATELET
BASOS ABS: 0 10*3/uL (ref 0.0–0.1)
Basophils Relative: 0 %
EOS ABS: 0.1 10*3/uL (ref 0.0–0.7)
EOS PCT: 2 %
HCT: 40.6 % (ref 36.0–46.0)
HEMOGLOBIN: 13.9 g/dL (ref 12.0–15.0)
LYMPHS PCT: 44 %
Lymphs Abs: 3 10*3/uL (ref 0.7–4.0)
MCH: 27.4 pg (ref 26.0–34.0)
MCHC: 34.2 g/dL (ref 30.0–36.0)
MCV: 80.1 fL (ref 78.0–100.0)
Monocytes Absolute: 0.4 10*3/uL (ref 0.1–1.0)
Monocytes Relative: 6 %
NEUTROS PCT: 48 %
Neutro Abs: 3.3 10*3/uL (ref 1.7–7.7)
PLATELETS: 220 10*3/uL (ref 150–400)
RBC: 5.07 MIL/uL (ref 3.87–5.11)
RDW: 13.8 % (ref 11.5–15.5)
WBC: 6.8 10*3/uL (ref 4.0–10.5)

## 2017-01-25 LAB — URINALYSIS, MICROSCOPIC (REFLEX): WBC, UA: NONE SEEN WBC/hpf (ref 0–5)

## 2017-01-25 LAB — COMPREHENSIVE METABOLIC PANEL
ALK PHOS: 29 U/L — AB (ref 38–126)
ALT: 18 U/L (ref 14–54)
AST: 17 U/L (ref 15–41)
Albumin: 4 g/dL (ref 3.5–5.0)
Anion gap: 4 — ABNORMAL LOW (ref 5–15)
BUN: 7 mg/dL (ref 6–20)
CHLORIDE: 106 mmol/L (ref 101–111)
CO2: 27 mmol/L (ref 22–32)
CREATININE: 0.97 mg/dL (ref 0.44–1.00)
Calcium: 9.2 mg/dL (ref 8.9–10.3)
GFR calc Af Amer: >60 mL/min (ref >60)
GFR calc non Af Amer: >60 mL/min (ref >60)
GLUCOSE: 87 mg/dL (ref 65–99)
Potassium: 4 mmol/L (ref 3.5–5.1)
SODIUM: 137 mmol/L (ref 135–145)
Total Bilirubin: 0.6 mg/dL (ref 0.3–1.2)
Total Protein: 7.3 g/dL (ref 6.5–8.1)

## 2017-01-25 LAB — URINALYSIS, ROUTINE W REFLEX MICROSCOPIC
Bilirubin Urine: NEGATIVE
Glucose, UA: NEGATIVE mg/dL
Ketones, ur: NEGATIVE mg/dL
Leukocytes, UA: NEGATIVE
Nitrite: NEGATIVE
PH: 7 (ref 5.0–8.0)
Protein, ur: NEGATIVE mg/dL
SPECIFIC GRAVITY, URINE: 1.015 (ref 1.005–1.030)

## 2017-01-25 LAB — PREGNANCY, URINE: PREG TEST UR: NEGATIVE

## 2017-01-25 MED ORDER — HYDROCODONE-ACETAMINOPHEN 5-325 MG PO TABS: 1.0000 | ORAL_TABLET | Freq: Four times a day (QID) | ORAL | 0 refills | Status: DC | PRN

## 2017-01-25 MED ORDER — TRAMADOL HCL 50 MG PO TABS: 50.0000 mg | ORAL_TABLET | Freq: Four times a day (QID) | ORAL | 0 refills | Status: DC | PRN

## 2017-01-25 MED ORDER — SODIUM CHLORIDE 0.9 % IV BOLUS (SEPSIS)
1000.0000 mL | Freq: Once | INTRAVENOUS | Status: AC
  Administered 2017-01-25: 1000 mL via INTRAVENOUS

## 2017-01-25 MED ORDER — IOPAMIDOL (ISOVUE-300) INJECTION 61%
100.0000 mL | Freq: Once | INTRAVENOUS | Status: AC | PRN
  Administered 2017-01-25: 100 mL via INTRAVENOUS

## 2017-01-25 MED ORDER — ONDANSETRON HCL 4 MG/2ML IJ SOLN
4.0000 mg | Freq: Once | INTRAMUSCULAR | Status: AC
Start: 2017-01-25 — End: 2017-01-25
  Administered 2017-01-25: 4 mg via INTRAVENOUS
  Filled 2017-01-25: qty 2

## 2017-01-25 MED ORDER — MORPHINE SULFATE (PF) 4 MG/ML IV SOLN
4.0000 mg | Freq: Once | INTRAVENOUS | Status: AC
  Administered 2017-01-25: 4 mg via INTRAVENOUS
  Filled 2017-01-25: qty 1

## 2017-01-25 MED ORDER — GI COCKTAIL ~~LOC~~
30.0000 mL | Freq: Once | ORAL | Status: AC
  Administered 2017-01-25: 30 mL via ORAL
  Filled 2017-01-25: qty 30

## 2017-01-25 MED ORDER — RANITIDINE HCL 150 MG PO TABS: 150.0000 mg | ORAL_TABLET | Freq: Two times a day (BID) | ORAL | 0 refills | Status: DC

## 2017-01-25 NOTE — ED Provider Notes (Signed)
[facility] DEPT Provider Note   CSN: [id number] Arrival date & time: 01/25/17  1127     History   Chief Complaint Chief Complaint  Patient presents with  . Abdominal Pain    HPI Brenda Mitchell is a 31 y.o. female.  HPI 31 year old female with past medical history as below who presents with left-sided abdominal pain. The patient was recently diagnosed with infectious mononucleosis. This occurred 2 and half weeks ago and she reportedly completed a course of steroids for this. Since then, the patient has had progressively worsening sharp, stabbing, positional left sided abdominal pain. She has reportedly been on bed rest per her PCPs recommendation but has begun moving recently. She describes progressive worsening of her pain since then. Pain is an aching, throbbing pain worse with palpation and movement. Denies any associated nausea or vomiting. No early satiety. Denies any lightheadedness, dizziness, or shortness of breath. She has not had any further fevers.  Past Medical History:  Diagnosis Date  . Anemia   . Asthma   . CRPS (complex regional pain syndrome type I)   . Erythema nodosum    on legs  . Fracture of foot, closed   . Mononucleosis   . Seasonal allergic rhinitis     Patient Active Problem List   Diagnosis Date Noted  . Wound check, abscess 08/07/2013  . Vaginal bleeding 08/20/2011  . Female pelvic pain 08/20/2011  . ERYTHEMA NODOSUM 05/01/2009  . ASTHMA 04/30/2009    Past Surgical History:  Procedure Laterality Date  . KNEE SURGERY    . MOUTH SURGERY      OB History    Gravida Para Term Preterm AB Living   0             SAB TAB Ectopic Multiple Live Births                   Home Medications    Prior to Admission medications   Medication Sig Start Date End Date Taking? Authorizing Provider  ALBUTEROL IN Inhale into the lungs.   Yes Historical Provider, MD  GABAPENTIN PO Take by mouth.   Yes Historical Provider, MD  HYDROcodone-acetaminophen  (NORCO/VICODIN) 5-325 MG tablet Take 1-2 tablets by mouth every 6 (six) hours as needed for severe pain. 01/25/17   Duffy Bruce, MD  ibuprofen (ADVIL,MOTRIN) 200 MG tablet Take 4 tablets (800 mg total) by mouth every 8 (eight) hours as needed for pain. For pain 08/20/11   Rosezella Rumpf, CNM  ranitidine (ZANTAC) 150 MG tablet Take 1 tablet (150 mg total) by mouth 2 (two) times daily. 01/25/17 02/01/17  Duffy Bruce, MD  traMADol (ULTRAM) 50 MG tablet Take 1 tablet (50 mg total) by mouth every 6 (six) hours as needed for moderate pain. 01/25/17   Duffy Bruce, MD    Family History Family History  Problem Relation Age of Onset  . Hypertension Mother   . Thyroid disease Mother     Social History Social History  Substance Use Topics  . Smoking status: Former Smoker    Quit date: 12/07/2003  . Smokeless tobacco: Never Used  . Alcohol use Yes     Comment: weekly     Allergies   Penicillins   Review of Systems Review of Systems  Constitutional: Positive for fatigue. Negative for chills and fever.  HENT: Negative for congestion, rhinorrhea and sore throat.   Eyes: Negative for visual disturbance.  Respiratory: Negative for cough, shortness of breath and wheezing.  Cardiovascular: Negative for chest pain and leg swelling.  Gastrointestinal: Positive for abdominal pain. Negative for diarrhea, nausea and vomiting.  Genitourinary: Negative for dysuria, flank pain, vaginal bleeding and vaginal discharge.  Musculoskeletal: Negative for neck pain.  Skin: Negative for rash.  Allergic/Immunologic: Negative for immunocompromised state.  Neurological: Negative for syncope and headaches.  Hematological: Does not bruise/bleed easily.  All other systems reviewed and are negative.    Physical Exam Updated Vital Signs BP 129/97   Pulse 98   Temp 98.3 F (36.8 C) (Oral)   Resp 16   Ht 5\' 6"  (1.676 m)   Wt 211 lb (95.7 kg)   LMP 01/23/2017   SpO2 100%   BMI 34.06 kg/m   Physical  Exam  Constitutional: She is oriented to person, place, and time. She appears well-developed and well-nourished. No distress.  HENT:  Head: Normocephalic and atraumatic.  Eyes: Conjunctivae are normal.  Neck: Neck supple.  Cardiovascular: Normal rate, regular rhythm and normal heart sounds.  Exam reveals no friction rub.   No murmur heard. Pulmonary/Chest: Effort normal and breath sounds normal. No respiratory distress. She has no wheezes. She has no rales.  Abdominal: Soft. Normal appearance. She exhibits no distension. There is splenomegaly. There is no hepatosplenomegaly or hepatomegaly. There is tenderness in the left upper quadrant. There is no rigidity, no rebound and no guarding. Hernia confirmed negative in the ventral area.  Musculoskeletal: She exhibits no edema.  Neurological: She is alert and oriented to person, place, and time. She exhibits normal muscle tone.  Skin: Skin is warm. Capillary refill takes less than 2 seconds.  Psychiatric: She has a normal mood and affect.  Nursing note and vitals reviewed.    ED Treatments / Results  Labs (all labs ordered are listed, but only abnormal results are displayed) Labs Reviewed  COMPREHENSIVE METABOLIC PANEL - Abnormal; Notable for the following:       Result Value   Alkaline Phosphatase 29 (*)    Anion gap 4 (*)    All other components within normal limits  URINALYSIS, ROUTINE W REFLEX MICROSCOPIC - Abnormal; Notable for the following:    Hgb urine dipstick SMALL (*)    All other components within normal limits  URINALYSIS, MICROSCOPIC (REFLEX) - Abnormal; Notable for the following:    Bacteria, UA RARE (*)    Squamous Epithelial / LPF 0-5 (*)    All other components within normal limits  CBC WITH DIFFERENTIAL/PLATELET  PREGNANCY, URINE    EKG  EKG Interpretation None       Radiology Ct Abdomen Pelvis W Contrast  Result Date: 01/25/2017 CLINICAL DATA:  LEFT upper quadrant pain for 3 weeks. Recent mononucleosis.  EXAM: CT ABDOMEN AND PELVIS WITH CONTRAST TECHNIQUE: Multidetector CT imaging of the abdomen and pelvis was performed using the standard protocol following bolus administration of intravenous contrast. CONTRAST:  116mL ISOVUE-300 IOPAMIDOL (ISOVUE-300) INJECTION 61% COMPARISON:  Abdominal ultrasound August 20, 2011 FINDINGS: LOWER CHEST: Lung bases are clear. Included heart size is normal. No pericardial effusion. HEPATOBILIARY: Liver and gallbladder are normal. PANCREAS: Normal. SPLEEN: Normal. ADRENALS/URINARY TRACT: Kidneys are orthotopic, demonstrating symmetric enhancement. No nephrolithiasis, hydronephrosis or solid renal masses. The unopacified ureters are normal in course and caliber. Urinary bladder is well distended and unremarkable. Normal adrenal glands. STOMACH/BOWEL: The stomach, small and large bowel are normal in course and caliber without inflammatory changes. Normal appendix. VASCULAR/LYMPHATIC: Aortoiliac vessels are normal in course and caliber. No lymphadenopathy by CT size criteria. Subcentimeter mesenteric lymph  nodes are likely reactive. REPRODUCTIVE: Normal. OTHER: No intraperitoneal free fluid or free air. MUSCULOSKELETAL: Nonacute.  Small fat containing umbilical hernia. IMPRESSION: No acute intra-abdominal or pelvic process ; normal spleen. Electronically Signed   By: Elon Alas M.D.   On: 01/25/2017 14:49    Procedures Procedures (including critical care time)  Medications Ordered in ED Medications  sodium chloride 0.9 % bolus 1,000 mL (0 mLs Intravenous Stopped 01/25/17 1523)  iopamidol (ISOVUE-300) 61 % injection 100 mL (100 mLs Intravenous Contrast Given 01/25/17 1432)  morphine 4 MG/ML injection 4 mg (4 mg Intravenous Given 01/25/17 1443)  ondansetron (ZOFRAN) injection 4 mg (4 mg Intravenous Given 01/25/17 1443)  gi cocktail (Maalox,Lidocaine,Donnatal) (30 mLs Oral Given 01/25/17 1523)     Initial Impression / Assessment and Plan / ED Course  I have reviewed  the triage vital signs and the nursing notes.  Pertinent labs & imaging results that were available during my care of the patient were reviewed by me and considered in my medical decision making (see chart for details).     31 year old female who presents with left upper quadrant pain in the setting of recent diagnosis of mono. On arrival, vital signs are stable and within normal limits. Exam is as above. Given her recent diagnosis of mononucleosis, CT scan obtained and fortunately shows no evidence of splenic infarction, hematoma, or rupture. Her CBC is stable with normal hemoglobin. She otherwise has no evidence of acute intra-abdominal pathology. She has no coughing and I do not suspect the pain is referred from her ribs. Primary suspicion is pain secondary to splenomegaly versus flank pain and musculoskeletal pain secondary to recent bed rest and moving. She otherwise has no evidence of acute emergent pathology. Will advise continued avoidance of any contact sports and discharge with analgesia and outpatient follow-up. Of note, she also endorses mild GERD-like pain and she did recently complete a course of steroids. Will place her on Zantac for this.  Final Clinical Impressions(s) / ED Diagnoses   Final diagnoses:  Flank pain  Mononucleosis    New Prescriptions Discharge Medication List as of 01/25/2017  3:48 PM    START taking these medications   Details  HYDROcodone-acetaminophen (NORCO/VICODIN) 5-325 MG tablet Take 1-2 tablets by mouth every 6 (six) hours as needed for severe pain., Starting Tue 01/25/2017, Print    ranitidine (ZANTAC) 150 MG tablet Take 1 tablet (150 mg total) by mouth 2 (two) times daily., Starting Tue 01/25/2017, Until Tue 02/01/2017, Print    traMADol (ULTRAM) 50 MG tablet Take 1 tablet (50 mg total) by mouth every 6 (six) hours as needed for moderate pain., Starting Tue 01/25/2017, Print         Duffy Bruce, MD 01/26/17 [phone]

## 2017-01-25 NOTE — ED Notes (Signed)
Patient transported to CT 

## 2017-01-25 NOTE — ED Triage Notes (Signed)
C/o left side abd pain x 3 weeks-worse today-dx with mono 2 weeks ago-PCP ordered US for next week-advised to come to ED due to increase in pain-pt with slow gait

## 2017-06-29 ENCOUNTER — Telehealth (INDEPENDENT_AMBULATORY_CARE_PROVIDER_SITE_OTHER): Payer: Self-pay

## 2017-06-29 NOTE — Telephone Encounter (Signed)
Received vm from this pt wanting to set up a 2nd opinion for her work comp injury with Dr. Lorin Mercy. Called her back and left her vm advising that any WC 2nd opinions have to be set up through the wc carrier and gave her my direct # to relay to them.

## 2017-08-02 ENCOUNTER — Ambulatory Visit (INDEPENDENT_AMBULATORY_CARE_PROVIDER_SITE_OTHER): Payer: Self-pay | Admitting: Orthopaedic Surgery

## 2017-11-24 ENCOUNTER — Encounter: Payer: Self-pay | Admitting: *Deleted

## 2017-11-30 ENCOUNTER — Ambulatory Visit: Payer: BLUE CROSS/BLUE SHIELD | Admitting: Diagnostic Neuroimaging

## 2017-12-07 ENCOUNTER — Other Ambulatory Visit: Payer: Self-pay | Admitting: Obstetrics & Gynecology

## 2017-12-07 ENCOUNTER — Encounter: Payer: Self-pay | Admitting: Diagnostic Neuroimaging

## 2017-12-08 ENCOUNTER — Other Ambulatory Visit: Payer: Self-pay

## 2017-12-08 ENCOUNTER — Encounter (HOSPITAL_COMMUNITY): Payer: Self-pay

## 2017-12-08 ENCOUNTER — Encounter (HOSPITAL_COMMUNITY)
Admission: RE | Admit: 2017-12-08 | Discharge: 2017-12-08 | Disposition: A | Discharge status: Home / Self Care | Payer: BLUE CROSS/BLUE SHIELD | Source: Ambulatory Visit | Attending: Obstetrics & Gynecology | Admitting: Obstetrics & Gynecology

## 2017-12-08 DIAGNOSIS — F329 Major depressive disorder, single episode, unspecified: Secondary | ICD-10-CM | POA: Insufficient documentation

## 2017-12-08 DIAGNOSIS — F419 Anxiety disorder, unspecified: Secondary | ICD-10-CM | POA: Diagnosis not present

## 2017-12-08 DIAGNOSIS — R102 Pelvic and perineal pain: Secondary | ICD-10-CM | POA: Diagnosis not present

## 2017-12-08 DIAGNOSIS — E7800 Pure hypercholesterolemia, unspecified: Secondary | ICD-10-CM | POA: Insufficient documentation

## 2017-12-08 DIAGNOSIS — J45909 Unspecified asthma, uncomplicated: Secondary | ICD-10-CM | POA: Diagnosis not present

## 2017-12-08 DIAGNOSIS — N939 Abnormal uterine and vaginal bleeding, unspecified: Secondary | ICD-10-CM | POA: Diagnosis not present

## 2017-12-08 DIAGNOSIS — D649 Anemia, unspecified: Secondary | ICD-10-CM | POA: Insufficient documentation

## 2017-12-08 DIAGNOSIS — L52 Erythema nodosum: Secondary | ICD-10-CM | POA: Diagnosis not present

## 2017-12-08 DIAGNOSIS — Z01812 Encounter for preprocedural laboratory examination: Secondary | ICD-10-CM | POA: Diagnosis not present

## 2017-12-08 HISTORY — DX: Anxiety disorder, unspecified: F41.9

## 2017-12-08 HISTORY — DX: Major depressive disorder, single episode, unspecified: F32.9

## 2017-12-08 HISTORY — DX: Depression, unspecified: F32.A

## 2017-12-08 LAB — BASIC METABOLIC PANEL
ANION GAP: 10 (ref 5–15)
BUN: 9 mg/dL (ref 6–20)
CALCIUM: 9.8 mg/dL (ref 8.9–10.3)
CO2: 28 mmol/L (ref 22–32)
CREATININE: 0.92 mg/dL (ref 0.44–1.00)
Chloride: 102 mmol/L (ref 101–111)
Glucose, Bld: 92 mg/dL (ref 65–99)
Potassium: 4.4 mmol/L (ref 3.5–5.1)
SODIUM: 140 mmol/L (ref 135–145)

## 2017-12-08 LAB — CBC
HCT: 41.6 % (ref 36.0–46.0)
Hemoglobin: 14.1 g/dL (ref 12.0–15.0)
MCH: 28 pg (ref 26.0–34.0)
MCHC: 33.9 g/dL (ref 30.0–36.0)
MCV: 82.5 fL (ref 78.0–100.0)
PLATELETS: 231 10*3/uL (ref 150–400)
RBC: 5.04 MIL/uL (ref 3.87–5.11)
RDW: 14.1 % (ref 11.5–15.5)
WBC: 8 10*3/uL (ref 4.0–10.5)

## 2017-12-08 NOTE — Patient Instructions (Addendum)
Your procedure is scheduled on:  Monday, Jan 7  Enter through the Main Entrance of [facility] at: 7:15 am  Pick up the phone at the desk and dial [phone].  Call this number if you have problems the morning of surgery: [phone].  Remember: Do NOT eat or Do NOT drink clear liquids (including water) after midnight Sunday  Take these medicines the morning of surgery with a SIP OF WATER: cymbalta, gabapentin  Bring albuterol inhaler with you on day of surgery.  Stop herbal medications and supplements at this time.  Do NOT wear jewelry (body piercing), metal hair clips/bobby pins, make-up, or nail polish. Do NOT wear lotions, powders, or perfumes.  You may wear deoderant. Do NOT shave for 48 hours prior to surgery. Do NOT bring valuables to the hospital.   Leave suitcase in car.  After surgery it may be brought to your room.  For patients admitted to the hospital, checkout time is 11:00 AM the day of discharge. Have a responsible adult drive you home and stay with you for 24 hours after your procedure.  Home with Mother Vaughan Basta cell [phone].

## 2017-12-09 NOTE — H&P (Signed)
Brenda Mitchell is an 32 y.o. female here for laparoscopic assisted vaginal hysterectomy and bilateral salpingectomy for her history of chronic pelvic pain, dysmenorrhea, menorrhagia, clinical endometriosis and adnenomyosis on ultrasound that has failed medical management.    Pertinent Gynecological History: Menses: heavy, monthly periods.  Contraception: OCP (estrogen/progesterone) DES exposure: unknown Blood transfusions: none Sexually transmitted diseases: no past history Previous GYN Procedures: None  Last pap: Negative. HPV pos.  Date: 12/02/17  OB History: G0P0   Menstrual History: Patient's last menstrual period was 11/20/2017 (approximate).    Past Medical History:  Diagnosis Date  . Anemia   . Anxiety   . Asthma   . CRPS (complex regional pain syndrome type I)   . Depression   . Erythema nodosum 2000   on legs  . Fracture of foot, closed    right  . Hypercholesterolemia    diet controlled - no meds  . Mononucleosis 2018  . Seasonal allergic rhinitis    Gastritis  Past Surgical History:  Procedure Laterality Date  . CRYOTHERAPY     precancer cells  . KNEE SURGERY Right    local and stichtes only  . MOUTH SURGERY     polyp removed lower left   . WISDOM TOOTH EXTRACTION      Family History  Problem Relation Age of Onset  . Hypertension Mother   . Thyroid disease Mother     Social History:  reports that she quit smoking about 14 years ago. Her smoking use included cigarettes. She has a 0.50 pack-year smoking history. she has never used smokeless tobacco. She reports that she drinks about 1.2 oz of alcohol per week. She reports that she does not use drugs.  Allergies:  Allergies  Allergen Reactions  . Penicillins Swelling and Rash    Has patient had a PCN reaction causing immediate rash, facial/tongue/throat swelling, SOB or lightheadedness with hypotension: Yes Has patient had a PCN reaction causing severe rash involving mucus membranes or skin necrosis:  No Has patient had a PCN reaction that required hospitalization: No Has patient had a PCN reaction occurring within the last 10 years: Yes If all of the above answers are "NO", then may proceed with Cephalosporin use.     Home medication: Sprintec   ROS  Constitutional: Denies fevers/chills Cardiovascular: Denies chest pain or palpitations Pulmonary: Denies coughing or wheezing Gastrointestinal: Denies nausea, vomiting or diarrhea Genitourinary: Denies unusual vaginal discharge, dysuria, urgency or frequency. With chronic pelvic pain, heavy periods, dysmenorrhea.   Musculoskeletal: With muscle or joint aches and pain, right hand.  Neurology: Denies abnormal sensations such as tingling or numbness.   Physical Exam  Wt 188 lbs Height 63ft 5 inches weight 188 lbs BMI 31 Vitals:   12/12/17 0717  BP: (!) 111/99  Pulse: 95  Resp: 18  Temp: 97.9 F (36.6 C)  TempSrc: Oral  SpO2: 100%   Constitutional: She is oriented to person, place, and time. She appears well-developed and well-nourished.  HENT:  Head: Normocephalic and atraumatic.  Neck: Normal range of motion.  Cardiovascular: Normal rate, regular rhythm and normal heart sounds.   Respiratory: Effort normal and breath sounds normal.  GI: Soft. Bowel sounds are normal.  Neurological: She is alert and oriented to person, place, and time.  Skin: Skin is warm and dry.  Psychiatric: She has a normal mood and affect. Her behavior is normal.   CBC    Component Value Date/Time   WBC 8.0 12/08/2017 1430   RBC 5.04 12/08/2017  1430   HGB 14.1 12/08/2017 1430   HCT 41.6 12/08/2017 1430   PLT 231 12/08/2017 1430   MCV 82.5 12/08/2017 1430   MCH 28.0 12/08/2017 1430   MCHC 33.9 12/08/2017 1430   RDW 14.1 12/08/2017 1430   LYMPHSABS 3.0 01/25/2017 1413   MONOABS 0.4 01/25/2017 1413   EOSABS 0.1 01/25/2017 1413   BASOSABS 0.0 01/25/2017 1413    BMP Latest Ref Rng & Units 12/08/2017 01/25/2017  Glucose 65 - 99 mg/dL 92 87  BUN  6 - 20 mg/dL 9 7  Creatinine 0.44 - 1.00 mg/dL 0.92 0.97  Sodium 135 - 145 mmol/L 140 137  Potassium 3.5 - 5.1 mmol/L 4.4 4.0  Chloride 101 - 111 mmol/L 102 106  CO2 22 - 32 mmol/L 28 27  Calcium 8.9 - 10.3 mg/dL 9.8 9.2   12/12/2017: Urine pregnancy test: negative.   Pelvic Ultrasound 09/28/17:        Assessment/Plan:  32 y/o with chronic pelvic pain, dysmenorrhea and menorrhagia and found to have clinical endometriosis, adenomyosis by ultrasound and small fibroids, who has failed conservative management with medication, now desiring hysterectomy, also desiring removal of both fallopian tubes to decrease her future risk of tubal and ovarian cancer, she is here for laparoscopic assisted vaginal hysterectomy with bilateral salpingectomy and cystoscopy, possible endometriosis resection/fulguration, possible laparotomy.    This procedure has been fully reviewed with the patient and written informed consent has been obtained.  We discussed risks including but not limited to heavy bleeding, infection and damage to organs.   -She would like conservation of her ovaries if they are normal but removal of any abnormal lesions if present or removal of ovaries if deemed appropriate.     -She understands she may continue with pelvic pain after this procedure and may require additional procedures in the future.    -She understands there are other conservative management options for her symptoms but she does not desire to pursue them, she desires a hysterectomy.    -We discussed that hysterectomy leads to sterilization.  Patient states understands all these points and she states she does not desire pregnancy, but may adopt children in the future.    -Admit for surgery -NPO - Antibiotic prophylaxis.   Alinda Dooms, MD.  12/09/2017, 6:19 PM

## 2017-12-12 ENCOUNTER — Encounter (HOSPITAL_COMMUNITY)
Admission: RE | Disposition: A | Discharge status: Home / Self Care | Payer: Self-pay | Source: Ambulatory Visit | Attending: Obstetrics & Gynecology

## 2017-12-12 ENCOUNTER — Encounter (HOSPITAL_COMMUNITY): Payer: Self-pay

## 2017-12-12 ENCOUNTER — Ambulatory Visit (HOSPITAL_COMMUNITY): Payer: BLUE CROSS/BLUE SHIELD | Admitting: Anesthesiology

## 2017-12-12 ENCOUNTER — Other Ambulatory Visit: Payer: Self-pay

## 2017-12-12 ENCOUNTER — Observation Stay (HOSPITAL_COMMUNITY)
Admission: RE | Admit: 2017-12-12 | Discharge: 2017-12-13 | Disposition: A | Discharge status: Home / Self Care | Payer: BLUE CROSS/BLUE SHIELD | Source: Ambulatory Visit | Attending: Obstetrics & Gynecology | Admitting: Obstetrics & Gynecology

## 2017-12-12 DIAGNOSIS — N808 Other endometriosis: Secondary | ICD-10-CM | POA: Diagnosis not present

## 2017-12-12 DIAGNOSIS — R102 Pelvic and perineal pain: Secondary | ICD-10-CM | POA: Diagnosis not present

## 2017-12-12 DIAGNOSIS — K219 Gastro-esophageal reflux disease without esophagitis: Secondary | ICD-10-CM | POA: Diagnosis not present

## 2017-12-12 DIAGNOSIS — G8929 Other chronic pain: Secondary | ICD-10-CM | POA: Diagnosis present

## 2017-12-12 DIAGNOSIS — Z880 Allergy status to penicillin: Secondary | ICD-10-CM | POA: Insufficient documentation

## 2017-12-12 DIAGNOSIS — F419 Anxiety disorder, unspecified: Secondary | ICD-10-CM | POA: Insufficient documentation

## 2017-12-12 DIAGNOSIS — F329 Major depressive disorder, single episode, unspecified: Secondary | ICD-10-CM | POA: Insufficient documentation

## 2017-12-12 DIAGNOSIS — D251 Intramural leiomyoma of uterus: Secondary | ICD-10-CM | POA: Diagnosis not present

## 2017-12-12 DIAGNOSIS — J45909 Unspecified asthma, uncomplicated: Secondary | ICD-10-CM | POA: Insufficient documentation

## 2017-12-12 DIAGNOSIS — Z87891 Personal history of nicotine dependence: Secondary | ICD-10-CM | POA: Diagnosis not present

## 2017-12-12 DIAGNOSIS — D252 Subserosal leiomyoma of uterus: Secondary | ICD-10-CM | POA: Diagnosis not present

## 2017-12-12 DIAGNOSIS — E7800 Pure hypercholesterolemia, unspecified: Secondary | ICD-10-CM | POA: Insufficient documentation

## 2017-12-12 HISTORY — PX: CYSTOSCOPY: SHX5120

## 2017-12-12 HISTORY — PX: BILATERAL SALPINGECTOMY: SHX5743

## 2017-12-12 HISTORY — PX: LAPAROSCOPIC ASSISTED VAGINAL HYSTERECTOMY: SHX5398

## 2017-12-12 LAB — PREGNANCY, URINE: PREG TEST UR: NEGATIVE

## 2017-12-12 SURGERY — HYSTERECTOMY, VAGINAL, LAPAROSCOPY-ASSISTED
Anesthesia: General | Site: Bladder

## 2017-12-12 MED ORDER — LACTATED RINGERS IV SOLN
INTRAVENOUS | Status: DC
  Administered 2017-12-12: 19:00:00 via INTRAVENOUS

## 2017-12-12 MED ORDER — PHENYLEPHRINE HCL 10 MG/ML IJ SOLN
INTRAMUSCULAR | Status: DC | PRN
  Administered 2017-12-12 (×2): 40 ug via INTRAVENOUS

## 2017-12-12 MED ORDER — ACETAMINOPHEN 500 MG PO TABS
ORAL_TABLET | ORAL | Status: AC
  Administered 2017-12-12: 1000 mg via ORAL
  Filled 2017-12-12: qty 2

## 2017-12-12 MED ORDER — KETOROLAC TROMETHAMINE 30 MG/ML IJ SOLN
INTRAMUSCULAR | Status: AC
  Filled 2017-12-12: qty 1

## 2017-12-12 MED ORDER — FENTANYL CITRATE (PF) 250 MCG/5ML IJ SOLN
INTRAMUSCULAR | Status: AC
  Filled 2017-12-12: qty 5

## 2017-12-12 MED ORDER — ONDANSETRON HCL 4 MG/2ML IJ SOLN
INTRAMUSCULAR | Status: AC
  Filled 2017-12-12: qty 2

## 2017-12-12 MED ORDER — KETOROLAC TROMETHAMINE 30 MG/ML IJ SOLN
30.0000 mg | Freq: Four times a day (QID) | INTRAMUSCULAR | Status: AC
  Administered 2017-12-12 – 2017-12-13 (×3): 30 mg via INTRAVENOUS
  Filled 2017-12-12 (×3): qty 1

## 2017-12-12 MED ORDER — ROCURONIUM BROMIDE 100 MG/10ML IV SOLN
INTRAVENOUS | Status: AC
  Filled 2017-12-12: qty 1

## 2017-12-12 MED ORDER — NEOSTIGMINE METHYLSULFATE 10 MG/10ML IV SOLN
INTRAVENOUS | Status: AC
  Filled 2017-12-12: qty 1

## 2017-12-12 MED ORDER — ROCURONIUM BROMIDE 100 MG/10ML IV SOLN
INTRAVENOUS | Status: DC | PRN
  Administered 2017-12-12: 10 mg via INTRAVENOUS
  Administered 2017-12-12: 50 mg via INTRAVENOUS
  Administered 2017-12-12: 5 mg via INTRAVENOUS

## 2017-12-12 MED ORDER — GLYCOPYRROLATE 0.2 MG/ML IJ SOLN
INTRAMUSCULAR | Status: AC
  Filled 2017-12-12: qty 1

## 2017-12-12 MED ORDER — SODIUM CHLORIDE 0.9 % IJ SOLN
INTRAMUSCULAR | Status: AC
Start: 2017-12-12 — End: ?
  Filled 2017-12-12: qty 10

## 2017-12-12 MED ORDER — GABAPENTIN 300 MG PO CAPS
ORAL_CAPSULE | ORAL | Status: AC
  Administered 2017-12-12: 300 mg via ORAL
  Filled 2017-12-12: qty 1

## 2017-12-12 MED ORDER — OXYCODONE-ACETAMINOPHEN 5-325 MG PO TABS
1.0000 | ORAL_TABLET | ORAL | Status: DC | PRN
  Administered 2017-12-12: 1 via ORAL
  Administered 2017-12-13: 2 via ORAL
  Filled 2017-12-12: qty 2
  Filled 2017-12-12: qty 1

## 2017-12-12 MED ORDER — HYDROMORPHONE HCL 1 MG/ML IJ SOLN
1.0000 mg | Freq: Four times a day (QID) | INTRAMUSCULAR | Status: DC | PRN
  Administered 2017-12-12: 1 mg via INTRAVENOUS
  Filled 2017-12-12: qty 1

## 2017-12-12 MED ORDER — SODIUM CHLORIDE 0.9 % IJ SOLN
INTRAMUSCULAR | Status: AC
  Filled 2017-12-12: qty 10

## 2017-12-12 MED ORDER — KETOROLAC TROMETHAMINE 30 MG/ML IJ SOLN
INTRAMUSCULAR | Status: DC | PRN
  Administered 2017-12-12: 30 mg via INTRAVENOUS

## 2017-12-12 MED ORDER — HYDROMORPHONE HCL 1 MG/ML IJ SOLN
INTRAMUSCULAR | Status: AC
  Filled 2017-12-12: qty 1

## 2017-12-12 MED ORDER — BUPIVACAINE-EPINEPHRINE 0.25% -1:200000 IJ SOLN
INTRAMUSCULAR | Status: DC | PRN
  Administered 2017-12-12: 15 mL
  Administered 2017-12-12: 5 mL
  Administered 2017-12-12: 8 mL

## 2017-12-12 MED ORDER — MIDAZOLAM HCL 2 MG/2ML IJ SOLN
INTRAMUSCULAR | Status: DC | PRN
  Administered 2017-12-12: 1.5 mg via INTRAVENOUS
  Administered 2017-12-12: .5 mg via INTRAVENOUS

## 2017-12-12 MED ORDER — IBUPROFEN 800 MG PO TABS: 800.0000 mg | ORAL_TABLET | Freq: Three times a day (TID) | ORAL | Status: DC | PRN

## 2017-12-12 MED ORDER — LIDOCAINE HCL (CARDIAC) 20 MG/ML IV SOLN
INTRAVENOUS | Status: AC
  Filled 2017-12-12: qty 5

## 2017-12-12 MED ORDER — PROPOFOL 10 MG/ML IV BOLUS
INTRAVENOUS | Status: DC | PRN
  Administered 2017-12-12: 150 mg via INTRAVENOUS

## 2017-12-12 MED ORDER — ONDANSETRON HCL 4 MG/2ML IJ SOLN
INTRAMUSCULAR | Status: DC | PRN
  Administered 2017-12-12: 2 mg via INTRAVENOUS

## 2017-12-12 MED ORDER — FENTANYL CITRATE (PF) 100 MCG/2ML IJ SOLN
25.0000 ug | INTRAMUSCULAR | Status: DC | PRN
  Administered 2017-12-12: 25 ug via INTRAVENOUS

## 2017-12-12 MED ORDER — FENTANYL CITRATE (PF) 100 MCG/2ML IJ SOLN
INTRAMUSCULAR | Status: AC
  Filled 2017-12-12: qty 2

## 2017-12-12 MED ORDER — SCOPOLAMINE 1 MG/3DAYS TD PT72
MEDICATED_PATCH | TRANSDERMAL | Status: AC
  Administered 2017-12-12: 1.5 mg via TRANSDERMAL
  Filled 2017-12-12: qty 1

## 2017-12-12 MED ORDER — PROMETHAZINE HCL 25 MG/ML IJ SOLN: 6.2500 mg | INTRAMUSCULAR | Status: DC | PRN

## 2017-12-12 MED ORDER — ALBUTEROL SULFATE (2.5 MG/3ML) 0.083% IN NEBU: 3.0000 mL | INHALATION_SOLUTION | RESPIRATORY_TRACT | Status: DC

## 2017-12-12 MED ORDER — DULOXETINE HCL 60 MG PO CPEP
60.0000 mg | ORAL_CAPSULE | Freq: Every day | ORAL | Status: DC
  Administered 2017-12-13: 60 mg via ORAL
  Filled 2017-12-12: qty 1

## 2017-12-12 MED ORDER — DEXAMETHASONE SODIUM PHOSPHATE 10 MG/ML IJ SOLN
INTRAMUSCULAR | Status: DC | PRN
  Administered 2017-12-12: 10 mg via INTRAVENOUS

## 2017-12-12 MED ORDER — GABAPENTIN 300 MG PO CAPS
300.0000 mg | ORAL_CAPSULE | Freq: Once | ORAL | Status: AC
  Administered 2017-12-12: 300 mg via ORAL

## 2017-12-12 MED ORDER — BUPIVACAINE-EPINEPHRINE (PF) 0.25% -1:200000 IJ SOLN
INTRAMUSCULAR | Status: AC
  Filled 2017-12-12: qty 30

## 2017-12-12 MED ORDER — ACETAMINOPHEN 500 MG PO TABS
1000.0000 mg | ORAL_TABLET | Freq: Once | ORAL | Status: AC
  Administered 2017-12-12: 1000 mg via ORAL

## 2017-12-12 MED ORDER — GENTAMICIN SULFATE 40 MG/ML IJ SOLN
INTRAVENOUS | Status: AC
  Administered 2017-12-12: 114.5 mL via INTRAVENOUS
  Filled 2017-12-12: qty 8.5

## 2017-12-12 MED ORDER — LACTATED RINGERS IR SOLN
Status: DC | PRN
  Administered 2017-12-12: 3000 mL

## 2017-12-12 MED ORDER — MIDAZOLAM HCL 2 MG/2ML IJ SOLN
INTRAMUSCULAR | Status: AC
  Filled 2017-12-12: qty 2

## 2017-12-12 MED ORDER — GABAPENTIN 300 MG PO CAPS
300.0000 mg | ORAL_CAPSULE | Freq: Two times a day (BID) | ORAL | Status: DC
  Administered 2017-12-12 – 2017-12-13 (×2): 300 mg via ORAL
  Filled 2017-12-12 (×2): qty 1

## 2017-12-12 MED ORDER — SUGAMMADEX SODIUM 200 MG/2ML IV SOLN
INTRAVENOUS | Status: AC
  Filled 2017-12-12: qty 2

## 2017-12-12 MED ORDER — SCOPOLAMINE 1 MG/3DAYS TD PT72
1.0000 | MEDICATED_PATCH | Freq: Once | TRANSDERMAL | Status: DC
  Administered 2017-12-12: 1.5 mg via TRANSDERMAL

## 2017-12-12 MED ORDER — GLYCOPYRROLATE 0.2 MG/ML IJ SOLN
INTRAMUSCULAR | Status: DC | PRN
  Administered 2017-12-12: 0.1 mg via INTRAVENOUS

## 2017-12-12 MED ORDER — PROPOFOL 10 MG/ML IV BOLUS
INTRAVENOUS | Status: AC
  Filled 2017-12-12: qty 20

## 2017-12-12 MED ORDER — FAMOTIDINE 20 MG PO TABS
20.0000 mg | ORAL_TABLET | Freq: Two times a day (BID) | ORAL | Status: DC
  Administered 2017-12-12 – 2017-12-13 (×2): 20 mg via ORAL
  Filled 2017-12-12 (×2): qty 1

## 2017-12-12 MED ORDER — HYDROMORPHONE HCL 1 MG/ML IJ SOLN
INTRAMUSCULAR | Status: DC | PRN
  Administered 2017-12-12: 1 mg via INTRAVENOUS

## 2017-12-12 MED ORDER — LACTATED RINGERS IV SOLN
INTRAVENOUS | Status: DC
  Administered 2017-12-12: 125 mL/h via INTRAVENOUS
  Administered 2017-12-12 (×2): via INTRAVENOUS
  Administered 2017-12-12: 125 mL/h via INTRAVENOUS

## 2017-12-12 MED ORDER — FENTANYL CITRATE (PF) 100 MCG/2ML IJ SOLN
INTRAMUSCULAR | Status: DC | PRN
  Administered 2017-12-12: 50 ug via INTRAVENOUS
  Administered 2017-12-12: 100 ug via INTRAVENOUS
  Administered 2017-12-12: 50 ug via INTRAVENOUS
  Administered 2017-12-12: 100 ug via INTRAVENOUS
  Administered 2017-12-12 (×2): 50 ug via INTRAVENOUS
  Administered 2017-12-12: 100 ug via INTRAVENOUS

## 2017-12-12 MED ORDER — PHENYLEPHRINE 40 MCG/ML (10ML) SYRINGE FOR IV PUSH (FOR BLOOD PRESSURE SUPPORT)
PREFILLED_SYRINGE | INTRAVENOUS | Status: AC
  Filled 2017-12-12: qty 10

## 2017-12-12 MED ORDER — SUGAMMADEX SODIUM 200 MG/2ML IV SOLN
INTRAVENOUS | Status: DC | PRN
  Administered 2017-12-12: 200 mg via INTRAVENOUS

## 2017-12-12 MED ORDER — MENTHOL 3 MG MT LOZG: 1.0000 | LOZENGE | OROMUCOSAL | Status: DC | PRN

## 2017-12-12 MED ORDER — DOCUSATE SODIUM 100 MG PO CAPS
100.0000 mg | ORAL_CAPSULE | Freq: Two times a day (BID) | ORAL | Status: DC
  Administered 2017-12-12 – 2017-12-13 (×2): 100 mg via ORAL
  Filled 2017-12-12 (×2): qty 1

## 2017-12-12 MED ORDER — DEXAMETHASONE SODIUM PHOSPHATE 10 MG/ML IJ SOLN
INTRAMUSCULAR | Status: AC
  Filled 2017-12-12: qty 1

## 2017-12-12 MED ORDER — LIDOCAINE HCL (CARDIAC) 20 MG/ML IV SOLN
INTRAVENOUS | Status: DC | PRN
  Administered 2017-12-12: 80 mg via INTRAVENOUS

## 2017-12-12 MED ORDER — IBUPROFEN 600 MG PO TABS: 600.0000 mg | ORAL_TABLET | Freq: Four times a day (QID) | ORAL | Status: DC | PRN

## 2017-12-12 MED ORDER — PROPOFOL 500 MG/50ML IV EMUL
INTRAVENOUS | Status: DC | PRN
  Administered 2017-12-12: 25 ug/kg/min via INTRAVENOUS

## 2017-12-12 MED ORDER — ONDANSETRON HCL 4 MG PO TABS: 4.0000 mg | ORAL_TABLET | Freq: Three times a day (TID) | ORAL | Status: DC | PRN

## 2017-12-12 SURGICAL SUPPLY — 48 items
CABLE HIGH FREQUENCY MONO STRZ (ELECTRODE) IMPLANT
CATH FOLEY 3WAY  5CC 16FR (CATHETERS) ×2
CATH FOLEY 3WAY 5CC 16FR (CATHETERS) ×2 IMPLANT
CONT PATH 16OZ SNAP LID 3702 (MISCELLANEOUS) ×4 IMPLANT
COVER BACK TABLE 60X90IN (DRAPES) ×4 IMPLANT
COVER MAYO STAND STRL (DRAPES) ×4 IMPLANT
DECANTER SPIKE VIAL GLASS SM (MISCELLANEOUS) ×4 IMPLANT
DERMABOND ADVANCED (GAUZE/BANDAGES/DRESSINGS) ×2
DERMABOND ADVANCED .7 DNX12 (GAUZE/BANDAGES/DRESSINGS) ×2 IMPLANT
DRSG OPSITE POSTOP 3X4 (GAUZE/BANDAGES/DRESSINGS) ×4 IMPLANT
DURAPREP 26ML APPLICATOR (WOUND CARE) ×4 IMPLANT
ELECT BLADE 6.5 EXT (BLADE) ×4 IMPLANT
ELECT REM PT RETURN 9FT ADLT (ELECTROSURGICAL) ×4
ELECTRODE REM PT RTRN 9FT ADLT (ELECTROSURGICAL) ×2 IMPLANT
GLOVE BIOGEL PI IND STRL 7.0 (GLOVE) ×10 IMPLANT
GLOVE BIOGEL PI INDICATOR 7.0 (GLOVE) ×10
GLOVE ECLIPSE 6.5 STRL STRAW (GLOVE) ×12 IMPLANT
GLOVE SURG SS PI 6.5 STRL IVOR (GLOVE) ×8 IMPLANT
LEGGING LITHOTOMY PAIR STRL (DRAPES) ×4 IMPLANT
LIGASURE VESSEL 5MM BLUNT TIP (ELECTROSURGICAL) ×4 IMPLANT
NEEDLE INSUFFLATION 120MM (ENDOMECHANICALS) ×4 IMPLANT
NS IRRIG 1000ML POUR BTL (IV SOLUTION) ×4 IMPLANT
PACK LAVH (CUSTOM PROCEDURE TRAY) ×4 IMPLANT
PACK ROBOTIC GOWN (GOWN DISPOSABLE) ×4 IMPLANT
PACK TRENDGUARD 450 HYBRID PRO (MISCELLANEOUS) ×2 IMPLANT
PLUG CATH AND CAP STER (CATHETERS) ×4 IMPLANT
POUCH SPECIMEN RETRIEVAL 10MM (ENDOMECHANICALS) IMPLANT
PROTECTOR NERVE ULNAR (MISCELLANEOUS) ×8 IMPLANT
SET CYSTO W/LG BORE CLAMP LF (SET/KITS/TRAYS/PACK) ×4 IMPLANT
SET IRRIG TUBING LAPAROSCOPIC (IRRIGATION / IRRIGATOR) ×4 IMPLANT
SLEEVE XCEL OPT CAN 5 100 (ENDOMECHANICALS) ×8 IMPLANT
SOLUTION ELECTROLUBE (MISCELLANEOUS) IMPLANT
SUT MNCRL AB 3-0 PS2 27 (SUTURE) ×8 IMPLANT
SUT PROLENE 0 CT 1 30 (SUTURE) ×4 IMPLANT
SUT VIC AB 0 CT1 18XCR BRD8 (SUTURE) ×4 IMPLANT
SUT VIC AB 0 CT1 27 (SUTURE) ×20
SUT VIC AB 0 CT1 27XBRD ANBCTR (SUTURE) ×20 IMPLANT
SUT VIC AB 0 CT1 8-18 (SUTURE) ×4
SUT VIC AB 1 CT1 36 (SUTURE) IMPLANT
SUT VIC AB 2-0 CT1 (SUTURE) ×8 IMPLANT
SUT VICRYL 0 TIES 12 18 (SUTURE) ×4 IMPLANT
SUT VICRYL 0 UR6 27IN ABS (SUTURE) ×8 IMPLANT
TOWEL OR 17X24 6PK STRL BLUE (TOWEL DISPOSABLE) ×8 IMPLANT
TRAY FOLEY CATH SILVER 14FR (SET/KITS/TRAYS/PACK) ×4 IMPLANT
TRENDGUARD 450 HYBRID PRO PACK (MISCELLANEOUS) ×4
TROCAR BALLN 12MMX100 BLUNT (TROCAR) ×4 IMPLANT
TROCAR XCEL NON-BLD 5MMX100MML (ENDOMECHANICALS) ×4 IMPLANT
WARMER LAPAROSCOPE (MISCELLANEOUS) ×4 IMPLANT

## 2017-12-12 NOTE — Discharge Instructions (Signed)
Call [facility] @ [phone] if:  You have a temperature greater than or equal to 100.4 degrees Farenheit orally You have pain that is not made better by the pain medication given and taken as directed You have excessive bleeding or problems urinating  Take Colace (Docusate Sodium/Stool Softener) 100 mg 2-3 times daily while taking narcotic pain medicine to avoid constipation or until bowel movements are regular. Take Ibuprofen 600 mg with food every 6 hours for 5 days then as needed for pain.  You may drive after 2 weeks You may walk up steps  You may shower  You may resume a regular diet  Keep incisions clean and dry; remove honeycomb dressing on 12/17/17 Do not lift over 15 pounds for 6 weeks Avoid anything in vagina for 6 weeks (or until after your post-operative visit)

## 2017-12-12 NOTE — Brief Op Note (Signed)
12/12/2017  1:53 PM  PATIENT: Brenda Mitchell.  32 y/o female  PRE-OPERATIVE DIAGNOSIS: Pelvic Pain  POST-OPERATIVE DIAGNOSIS:  Pelvic pain  PROCEDURE:  Procedure(s) with comments: LAPAROSCOPIC ASSISTED VAGINAL HYSTERECTOMY WITH BILATERAL SALPINGECTOMY AND BIOPSIES OF POSTERIOR CUL DE [place] (Bilateral) - 3 Hours CYSTOSCOPY (N/A)  SURGEON:  Surgeon(s) and Role:    Waymon Amato, MD - Primary  PHYSICIAN ASSISTANT: Earnstine Regal  ASSISTANTS: Arica (Scrub technician)  ANESTHESIA:   general  EBL:  150 mL   BLOOD ADMINISTERED:none  DRAINS: none   LOCAL MEDICATIONS USED:  MARCAINE with epinephrine, Amount: 15 ml.   SPECIMEN:   1. Uterus with cervix.  2. Right and left fallopian tubes. 3. Biopsies from posterior cul de sac and uterosacral ligaments, suspect endometriosis.   DISPOSITION OF SPECIMEN:  PATHOLOGY  COUNTS:  YES  TOURNIQUET:  * No tourniquets in log *  DICTATION: .Note written in [place]: Admit for overnight observation  PATIENT DISPOSITION:  PACU - hemodynamically stable.   Delay start of Pharmacological VTE agent (>24hrs) due to surgical blood loss or risk of bleeding: not applicable

## 2017-12-12 NOTE — Interval H&P Note (Signed)
History and Physical Interval Note:  12/12/2017 8:54 AM  Brenda Mitchell  has presented today for surgery, with the diagnosis of pelvic pain                                                                                                                                         Pelvic Pain  The various methods of treatment have been discussed with the patient and family. After consideration of risks, benefits and other options for treatment, the patient has consented to  Procedure(s) with comments: [place] (Bilateral) - 3 Hours, bilateral salpingectomy, possible ovarian cyst removal, possible oophorectomy, possible endometriosis resection/fulguration, possible laparotomy and cystoscopy as a surgical intervention .  The patient's history has been reviewed, patient examined, no change in status, stable for surgery.  I have reviewed the patient's chart and labs.  Questions were answered to the patient's satisfaction.     Alinda Dooms, MD.

## 2017-12-12 NOTE — Addendum Note (Signed)
Addendum  created 12/12/17 1959 by Hewitt Blade, CRNA   Sign clinical note

## 2017-12-12 NOTE — Anesthesia Postprocedure Evaluation (Signed)
Anesthesia Post Note  Patient: Brenda Mitchell Cancer  Procedure(s) Performed: LAPAROSCOPIC ASSISTED VAGINAL HYSTERECTOMY WITH BILATERAL SALPINGECTOMY AND BIOPSIES OF POSTERIOR CUL DE [place] (Bilateral Abdomen) CYSTOSCOPY (N/A Bladder)     Patient location during evaluation: [facility] Anesthesia Type: General Level of consciousness: awake and alert and oriented Pain management: pain level controlled Vital Signs Assessment: post-procedure vital signs reviewed and stable Respiratory status: spontaneous breathing, nonlabored ventilation, patient connected to nasal cannula oxygen and respiratory function stable Cardiovascular status: blood pressure returned to baseline and stable Postop Assessment: no apparent nausea or vomiting Anesthetic complications: no    Last Vitals:  Vitals:   12/12/17 1629 12/12/17 1734  BP:  110/68  Pulse:  89  Resp:  15  Temp:  36.9 C  SpO2: 99% 99%    Last Pain:  Vitals:   12/12/17 1942  TempSrc:   PainSc: 4    Pain Goal: Patients Stated Pain Goal: 4 (12/12/17 1942)               Jabier Mutton

## 2017-12-12 NOTE — Anesthesia Preprocedure Evaluation (Signed)
Anesthesia Evaluation  Patient identified by MRN, date of birth, ID band Patient awake    Reviewed: Allergy & Precautions, NPO status , Patient's Chart, lab work & pertinent test results  Airway Mallampati: II  TM Distance: >3 FB Neck ROM: Full    Dental  (+) Teeth Intact, Dental Advisory Given   Pulmonary asthma , former smoker,    Pulmonary exam normal breath sounds clear to auscultation       Cardiovascular negative cardio ROS Normal cardiovascular exam Rhythm:Regular Rate:Normal     Neuro/Psych PSYCHIATRIC DISORDERS Anxiety Depression  Neuromuscular disease (CRPS Type 1)    GI/Hepatic Neg liver ROS, GERD  Medicated,  Endo/Other  negative endocrine ROSObesity   Renal/GU negative Renal ROS     Musculoskeletal negative musculoskeletal ROS (+)   Abdominal   Peds  Hematology negative hematology ROS (+)   Anesthesia Other Findings Day of surgery medications reviewed with the patient.  Reproductive/Obstetrics Pelvic pain                              Anesthesia Physical Anesthesia Plan  ASA: II  Anesthesia Plan: General   Post-op Pain Management:    Induction: Intravenous  PONV Risk Score and Plan: 4 or greater and Scopolamine patch - Pre-op, Midazolam, Dexamethasone and Ondansetron  Airway Management Planned: Oral ETT  Additional Equipment:   Intra-op Plan:   Post-operative Plan: Extubation in OR  Informed Consent: I have reviewed the patients History and Physical, chart, labs and discussed the procedure including the risks, benefits and alternatives for the proposed anesthesia with the patient or authorized representative who has indicated his/her understanding and acceptance.   Dental advisory given  Plan Discussed with: CRNA  Anesthesia Plan Comments: (Risks/benefits of general anesthesia discussed with patient including risk of damage to teeth, lips, gum, and tongue,  nausea/vomiting, allergic reactions to medications, and the possibility of heart attack, stroke and death.  All patient questions answered.  Patient wishes to proceed.)        Anesthesia Quick Evaluation

## 2017-12-12 NOTE — Anesthesia Postprocedure Evaluation (Signed)
Anesthesia Post Note  Patient: Brenda Mitchell  Procedure(s) Performed: LAPAROSCOPIC ASSISTED VAGINAL HYSTERECTOMY WITH BILATERAL SALPINGECTOMY AND BIOPSIES OF POSTERIOR CUL DE [place] (Bilateral Abdomen) CYSTOSCOPY (N/A Bladder)     Patient location during evaluation: PACU Anesthesia Type: General Level of consciousness: awake and alert Pain management: pain level controlled Vital Signs Assessment: post-procedure vital signs reviewed and stable Respiratory status: spontaneous breathing, nonlabored ventilation and respiratory function stable Cardiovascular status: blood pressure returned to baseline and stable Postop Assessment: no apparent nausea or vomiting Anesthetic complications: no    Last Vitals:  Vitals:   12/12/17 1545 12/12/17 1600  BP: 116/78 109/77  Pulse: (!) 105 91  Resp: 18 13  Temp: 37.1 C 37.1 C  SpO2: 100% 99%    Last Pain:  Vitals:   12/12/17 1600  TempSrc:   PainSc: 4    Pain Goal: Patients Stated Pain Goal: 4 (12/12/17 1600)               Catalina Gravel

## 2017-12-12 NOTE — Progress Notes (Signed)
Day of Surgery Procedure(s) (LRB): LAPAROSCOPIC ASSISTED VAGINAL HYSTERECTOMY WITH BILATERAL SALPINGECTOMY AND BIOPSIES OF POSTERIOR CUL DE SAC (Bilateral) CYSTOSCOPY (N/A)  Subjective: Patient reports she is tolerating clears.  She denies chest pain/shortness of breath. She has not ambulated yet. Her pain is moderately well controlled, she feels upper and bilateral lower abdominal pain, back pain.   Objective: I have reviewed patient's vital signs, intake and output and medications. Vitals:   12/12/17 1628 12/12/17 1629 12/12/17 1734 12/12/17 1744  BP: 110/70  110/68   Pulse: (!) 101  89   Resp: 15  15   Temp: 99.3 F (37.4 C)  98.4 F (36.9 C)   TempSrc: Oral  Axillary   SpO2: 97% 99% 99%   Weight:    85.7 kg (189 lb)  Height:    5\' 4"  (1.626 m)   General: alert, cooperative and no distress Resp: clear to auscultation bilaterally Cardio: regular rate and rhythm, S1, S2 normal, no murmur, click, rub or gallop GI: soft, tender to palpation diffusely in mid upper, bilateral lower abdomen, left side greater than right side; bowel sounds normal; no masses,  no organomegaly Extremities: extremities normal, atraumatic, no cyanosis or edema Vaginal Bleeding: minimal Assessment: s/p Procedure(s) with comments: LAPAROSCOPIC ASSISTED VAGINAL HYSTERECTOMY WITH BILATERAL SALPINGECTOMY AND BIOPSIES OF POSTERIOR CUL DE SAC (Bilateral) - 3 Hours CYSTOSCOPY (N/A): stable  Plan: Advance diet Encourage ambulation Clear liquids  For foley d/c once ambulating without any problems Pain control as needed.  Discussed intra-op findings and procedure in detail.  .   LOS: 0 days    Alinda Dooms, MD.  12/12/2017, 6:27 PM

## 2017-12-12 NOTE — Op Note (Signed)
Brenda Mitchell, Brenda Mitchell Female, 32 y.o., 01-29-1986 MRN: [id number]  Pre-op diagnosis:  1. Chronic pelvic pain  2. Dysmenorrhea and menorrhagia 3. Endometriosis 4. Fibroids 5. Failed conservative management  6. Desire to decrease future risk of ovarian and fallopian tube cancer.   Post-op diagnosis: Same as above  Surgeries:  1. Laparoscopic assisted Vaginal hysterectomy with bilateral salpingectomy 2. Cystoscopy  3. Resection of lesions in cul de sac and uterosacral ligaments, suspect endometriosis.     Surgeon: Dr. Waymon Amato   Assistant: Earnstine Regal, PA  Anesthesia: General  IV fluids:  3000 cc  EBL: 150cc  Urine output: 324 cc   Complications: None  Findings: 8 week sized uterus.  Normal right and left ovaries and fallopian tubes.  Normal bladder with bilateral ureteral jets.  Several dark brown powder-like lesions in posterior cul de sac and uterosacral ligaments, suspicious for endometriosis.  Normal appendix, normal appearing bowels and liver.   Indications: 32 y/o G0 history of chronic pelvic pain, menorrhagia and dysmenorrhea who failed conservative medical  treatment now desiring definitive treatment by hysterectomy.  Her work up was significant for fibroids and adenomyosis on ultrasound.  Patient also desired to decrease her future risk of fallopian tube and ovarian cancer by bilateral fallopian tube removal.  She did not desire a pregnancy in the future and understood that a hysterectomy leads to sterilization.         Procedure: Informed consent was obtained from the patient. She was taken to the operating room where anesthesia was administered.  She was carefully positioned in the operating room table in dorsal lithotomy position with both arms tucked to her sides.  An exam under anesthesia revealed an 8 week sized mobile uterus with no palpable adnexal masses.  She was prepped and draped in the usual sterile fashion.   Weighted speculum and anterior Heaney  tractors were used to view the cervix.  Hulka tenaculum was placed into cervix.  Foley catheter placed in the bladder.  Attention was then turned to the abdomen.  Open entry performed below umbilicus to allow placement of a 12 mm Hasson.  Abdomen was insufflated with CO2 gas.  Two other 26mm ports were placed in lower abdominal quadrants were placed under direct visualization.  The abdomen was surveyed with above findings.  The powder like lesions in the posterior cul de sac and uterosacral ligaments were biopsied with the laparoscopy biopsy forcep in their entirety.  The left fallopian tube was excised with the 105mm ligasure impact and placed in the cul de sac.  The left uterosacral and left round ligament were also transected.  Bladder flap on the left side was created.  Attention was turned to the right side where similar steps were done.  Ligasure and endo-shears allowed limited bladder flap dissection in the right side.  All laparocopic instruments were then removed leaving the ports in place and attention was then turned to the vagina, gas was turned off.    Weighted speculum was placed into vagina and anterior retractor placed in.  Hulka tenaculum was then removed.  Two single tooth tenaculum were placed on cervix at 3 and 9 o'clocks.  The cervix was injected with 0.25% marcaine with epinephrine circumferentially about 1.5 cm from the external os. About 10 cc total used causing blanching of the cervical tissue. Colpotomy was created with bovie.  The vagina was dissected off the cervical stroma anterioly and posteriorly.  The posterior peritoneum of the cul-de-sac was identifed  and then entered sharply with  the Mayo scissors.  Entry into the anterior cul de sac was difficult therefore procedure was continued posteriorly.  The uterosacral ligaments bilaterally were clamped cut and suture ligated with transfixing stitch, 0- vicryl was used unless stated otherwise.  Cardinal ligaments were similarly clamped, cut  and suture ligated with a transfixing stitch taking care to close the dead space between the pedicles. The uterine  Vessels were similarly clamped, cut and suture ligated.  Anterior cul de sac entry was then performed. Broad ligament tissue were then clamped bilaterally, cut and free tied, then suture ligature placed.  The uterus with cervix was then delivered.           All pedicles were examined and noted to the hemostatic.  Irrigation was applied and suctioned out. External McCall Culdoplasty was performed with 0 vicryl.  Internal McCall stitches were performed with 0 prolene and tied at the center.  Vaginal wall was closed horizontally with 0-vicryl.  External Mcall suture was tied off with good support noted.  Cystoscopy was perfomed and revealed a normal bladder and patent bilateral ureters with jets.  Foley was then placed back in.     Attention was turned back to abdomen where CO2 gas was insufflated.  Irrigation was applied and suctioned out.  The serosal edge was noted to have punctate bleeding, Arista powder was then applied with excellent hemostasis noted.  The vaginal cuff was noted to be hemostatic.  All instruments were removed and trocar sites noted to be hemostatic and CO2 was allowed to escape the abdomen.  Final count was correct.  0 vicryl was used to close the infraumbilical incision fascia.  3-0 monocryl was used to close the abdominal skin incisions.  Dermabond was applied and incisions covered with tegaderm.  She was then cleaned, awoken from anesthesia and taken to recovery room in stable condition.   Specimens:   1. Uterus with cervix.   2. Right and left fallopian tubes. 3. Biopsies from posterior cul de sac and uterosacral ligaments, suspect endometriosis.      Disposition: TO PACU in stable condition.    Dr.Taleigha Pinson.

## 2017-12-12 NOTE — Transfer of Care (Signed)
Immediate Anesthesia Transfer of Care Note  Patient: Brenda Mitchell  Procedure(s) Performed: LAPAROSCOPIC ASSISTED VAGINAL HYSTERECTOMY WITH BILATERAL SALPINGECTOMY AND BIOPSIES OF POSTERIOR CUL DE [place] (Bilateral Abdomen) CYSTOSCOPY (N/A Bladder)  Patient Location: PACU  Anesthesia Type:General  Level of Consciousness: awake, alert  and oriented  Airway & Oxygen Therapy: Patient Spontanous Breathing and Patient connected to nasal cannula oxygen  Post-op Assessment: Report given to RN, Post -op Vital signs reviewed and stable and Patient moving all extremities X 4  Post vital signs: Reviewed and stable  Last Vitals:  Vitals:   12/12/17 0717 12/12/17 1325  BP: (!) 111/99   Pulse: 95 (!) 106  Resp: 18   Temp: 36.6 C 37 C  SpO2: 100%     Last Pain:  Vitals:   12/12/17 0717  TempSrc: Oral  PainSc: 4       Patients Stated Pain Goal: 4 (50/03/70 4888)  Complications: No apparent anesthesia complications

## 2017-12-12 NOTE — Anesthesia Procedure Notes (Signed)
Procedure Name: Intubation Date/Time: 12/12/2017 9:15 AM Performed by: Catalina Gravel, MD Pre-anesthesia Checklist: Patient identified, Patient being monitored, Timeout performed, Emergency Drugs available and Suction available Patient Re-evaluated:Patient Re-evaluated prior to induction Oxygen Delivery Method: Circle System Utilized Preoxygenation: Pre-oxygenation with 100% oxygen Induction Type: IV induction Ventilation: Mask ventilation without difficulty Laryngoscope Size: Miller and 2 Grade View: Grade II Tube type: Oral Tube size: 7.0 mm Number of attempts: 1 Airway Equipment and Method: stylet Placement Confirmation: ETT inserted through vocal cords under direct vision,  positive ETCO2 and breath sounds checked- equal and bilateral Secured at: 21 cm Tube secured with: Tape Dental Injury: Teeth and Oropharynx as per pre-operative assessment

## 2017-12-13 ENCOUNTER — Encounter (HOSPITAL_COMMUNITY): Payer: Self-pay | Admitting: Obstetrics & Gynecology

## 2017-12-13 DIAGNOSIS — D251 Intramural leiomyoma of uterus: Secondary | ICD-10-CM | POA: Diagnosis not present

## 2017-12-13 LAB — CBC
HCT: 34.3 % — ABNORMAL LOW (ref 36.0–46.0)
Hemoglobin: 11.7 g/dL — ABNORMAL LOW (ref 12.0–15.0)
MCH: 27.9 pg (ref 26.0–34.0)
MCHC: 34.1 g/dL (ref 30.0–36.0)
MCV: 81.7 fL (ref 78.0–100.0)
Platelets: 175 10*3/uL (ref 150–400)
RBC: 4.2 MIL/uL (ref 3.87–5.11)
RDW: 14 % (ref 11.5–15.5)
WBC: 10.9 10*3/uL — AB (ref 4.0–10.5)

## 2017-12-13 LAB — BASIC METABOLIC PANEL
ANION GAP: 7 (ref 5–15)
BUN: 7 mg/dL (ref 6–20)
CO2: 25 mmol/L (ref 22–32)
Calcium: 8.2 mg/dL — ABNORMAL LOW (ref 8.9–10.3)
Chloride: 107 mmol/L (ref 101–111)
Creatinine, Ser: 0.76 mg/dL (ref 0.44–1.00)
GFR calc Af Amer: >60 mL/min (ref >60)
Glucose, Bld: 96 mg/dL (ref 65–99)
POTASSIUM: 4.1 mmol/L (ref 3.5–5.1)
SODIUM: 139 mmol/L (ref 135–145)

## 2017-12-13 MED ORDER — OXYCODONE-ACETAMINOPHEN 5-325 MG PO TABS: ORAL_TABLET | ORAL | 0 refills | Status: DC

## 2017-12-13 MED ORDER — IBUPROFEN 600 MG PO TABS: ORAL_TABLET | ORAL | 1 refills | Status: AC

## 2017-12-13 NOTE — Discharge Summary (Signed)
Physician Discharge Summary  Patient ID: Brenda Mitchell MRN: [id number] DOB/AGE: 32/11/1986 32 y.o.  Admit date: 12/12/2017 Discharge date: 12/13/2017  Admission diagnosis:  Chronic pelvic pain, dysmenorrhea, menorrhagia, endometriosis, fibroids.   Discharge Diagnoses:  Same as above.   Operation: Laparoscopically Assisted Vaginal Hysterectomy, Bilateral Salpingectomy, Cystoscopy and Biopsy of Posterior Cul-de-Sac   Discharged Condition: Good  Hospital Course: On the date of admission the patient underwent the aforementioned procedures and tolerated them well.  Post operative course was unremarkable with the patient resuming bowel and bladder function by post operative day #1 and was therefore deemed ready for discharge home.  Discharge hemoglobin was 11.7.  Disposition: 01-Home or Self Care  Discharge Medications:  Percocet 5/325 mg  1 every 6 hours as needed for pain Ibuprofen 600 mg with food every 6 hours for pain Gabapentin 100 mg  bid Duloxetine 30 mg  daily Ventolin Inhaler 90 mcg/actuations 2 puffs every 6 hours prn Multivitamin  daily     Follow-up: Dr. Alesia Richards on January 19, 2018 at 8:30 a.m.   Signed: Earnstine Regal, PA-C 12/13/2017, 7:11 AM

## 2017-12-13 NOTE — Progress Notes (Signed)
Out in wheelchair   With mom  Teaching complete

## 2017-12-13 NOTE — Progress Notes (Signed)
Brenda Mitchell is a31 y.o.  [id number]  Post Op Date # 1:  LAVH/BS/Cystoscopy  Subjective: Patient is Doing well postoperatively. Patient has Pain is controlled with current analgesics. Medications being used: prescription NSAID's including Ketorolac 30 mg IV and narcotic analgesics including Percocet 5/325. Ambulating in the halls with transient lightheadedness with first standing that resolves with walking,  tolerating a regular diet and voiding without difficulty.    Objective: Vital signs in last 24 hours: Temp:  [97.9 F (36.6 C)-99.3 F (37.4 C)] 99 F (37.2 C) (01/08 0417) Pulse Rate:  [88-109] 91 (01/08 0417) Resp:  [10-18] 18 (01/08 0417) BP: (88-116)/(49-99) 95/49 (01/08 0417) SpO2:  [94 %-100 %] 100 % (01/08 0417) Weight:  [189 lb (85.7 kg)] 189 lb (85.7 kg) (01/07 1744)  Intake/Output from previous day: 01/07 0701 - 01/08 0700 In: 3600 [I.V.:3600] Out: 4850 [Urine:4700] Intake/Output this shift: No intake/output data recorded. Recent Labs  Lab 12/08/17 1430 12/13/17 0517  WBC 8.0 10.9*  HGB 14.1 11.7*  HCT 41.6 34.3*  PLT 231 175     Recent Labs  Lab 12/08/17 1430 12/13/17 0517  NA 140 139  K 4.4 4.1  CL 102 107  CO2 28 25  BUN 9 7  CREATININE 0.92 0.76  CALCIUM 9.8 8.2*  GLUCOSE 92 96    EXAM: General: alert, cooperative and no distress Resp: clear to auscultation bilaterally Cardio: regular rate and rhythm, S1, S2 normal, no murmur, click, rub or gallop GI: Bowel sounds present, soft with clean/dry/intact dressings on abdomen. Extremities: Homans sign is negative, no sign of DVT and no calf tenderness. Vaginal Bleeding: none   Assessment: s/p Procedure(s): LAPAROSCOPIC ASSISTED VAGINAL HYSTERECTOMY WITH BILATERAL SALPINGECTOMY AND BIOPSIES OF POSTERIOR CUL DE [place] CYSTOSCOPY: stable, progressing well and tolerating diet  Plan: Routine care  Probable discharge home today  LOS: 0 days    Earnstine Regal, PA-C 12/13/2017 7:02 AM

## 2018-04-20 IMAGING — CT CT ABD-PELV W/ CM
2 of 4 series · 16 of 46 positions shown, 18 images · IV contrast (APPLIED)
Comparison: Abdominal ultrasound August 20, 2011

CLINICAL DATA: LEFT upper quadrant pain for 3 weeks. Recent
mononucleosis.

EXAM:
CT ABDOMEN AND PELVIS WITH CONTRAST
TECHNIQUE: Multidetector CT imaging of the abdomen and pelvis was performed
using the standard protocol following bolus administration of
intravenous contrast.
CONTRAST:  100mL ML6PRE-Y55 IOPAMIDOL (ML6PRE-Y55) INJECTION 61%

[Series 2: axial st · axial · 0.77mm/px · z∈[-457,-7]mm · 13 of 98 slices shown, 15 images]
[im 4/98  soft-tissue]
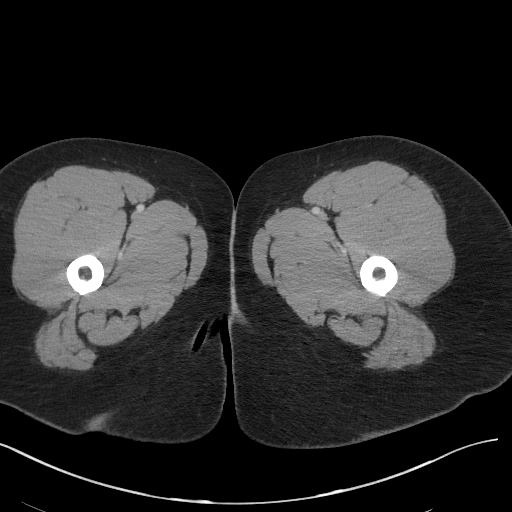
[im 4/98  bone]
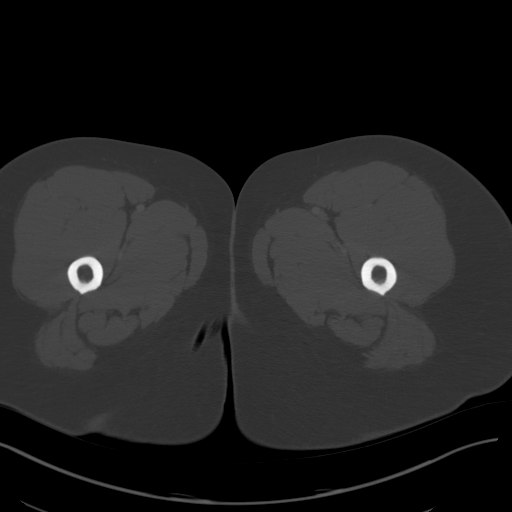
[im 12/98  soft-tissue]
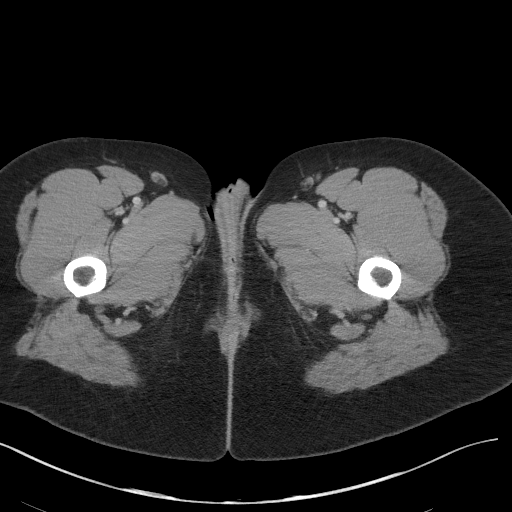
[im 20/98  soft-tissue]
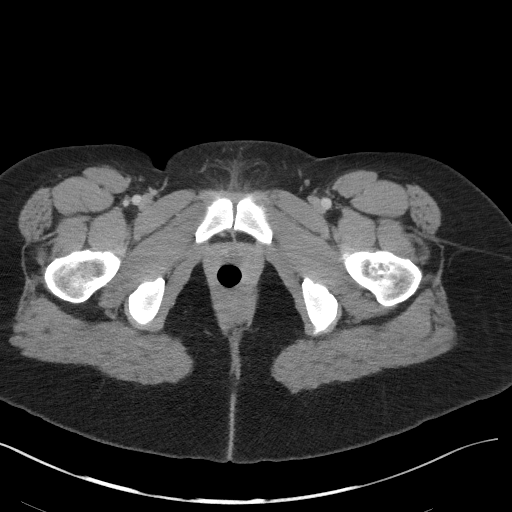
[im 28/98  soft-tissue]
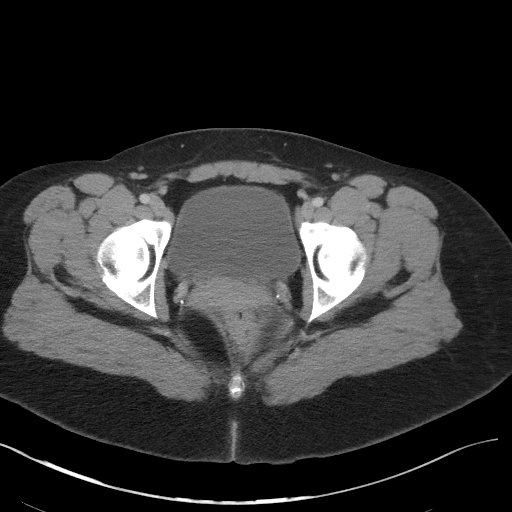
[im 35/98  soft-tissue]
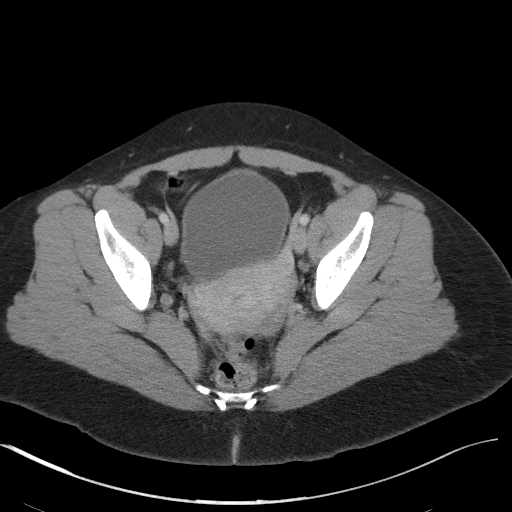
[im 43/98  soft-tissue]
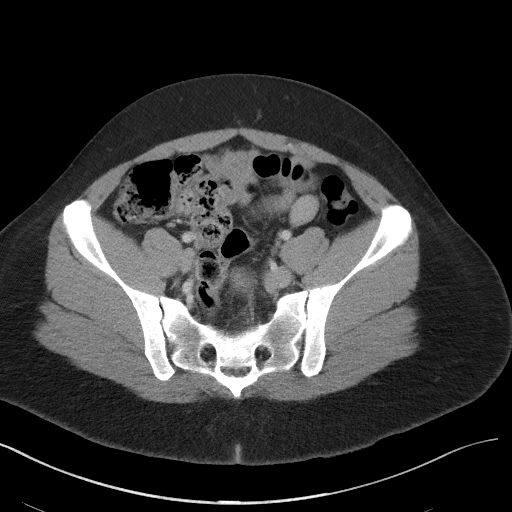
[im 51/98  soft-tissue]
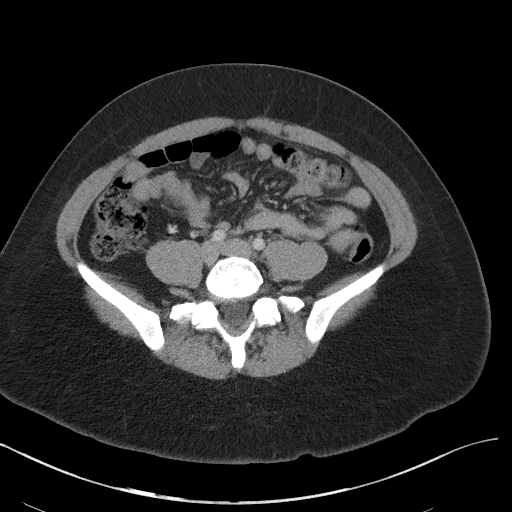
[im 55/98  soft-tissue]
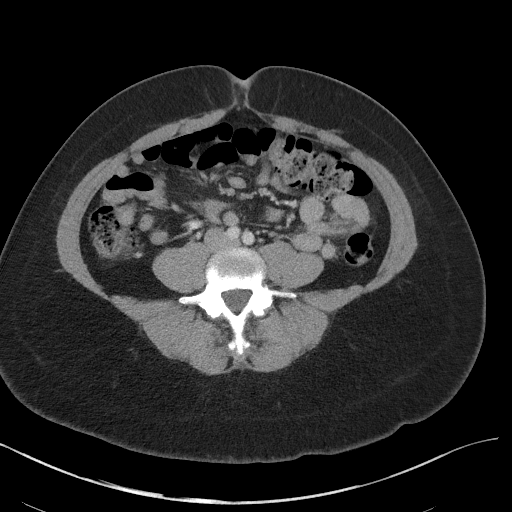
[im 63/98  soft-tissue]
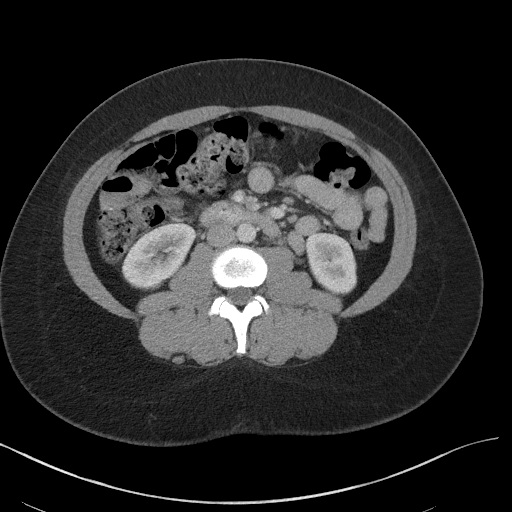
[im 63/98  bone]
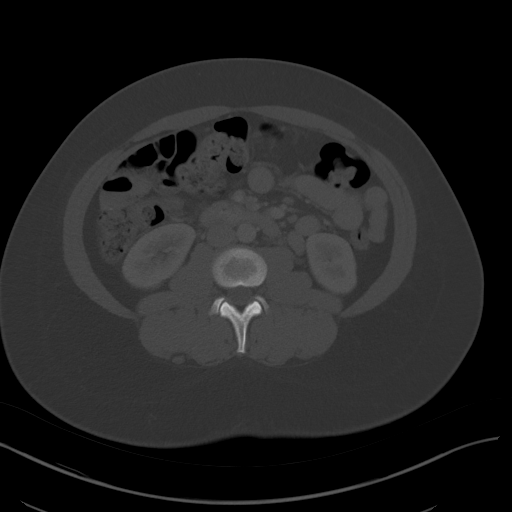
[im 70/98  soft-tissue]
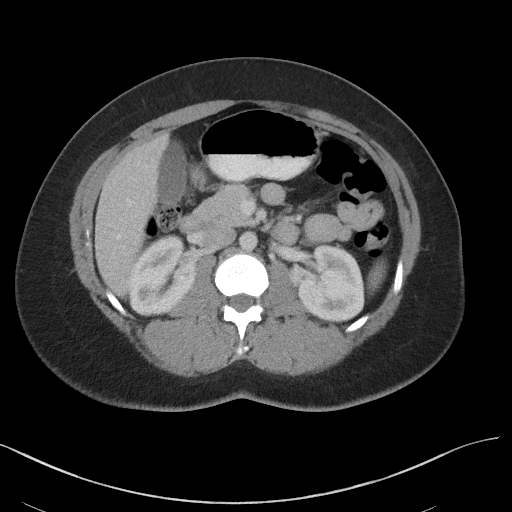
[im 78/98  soft-tissue]
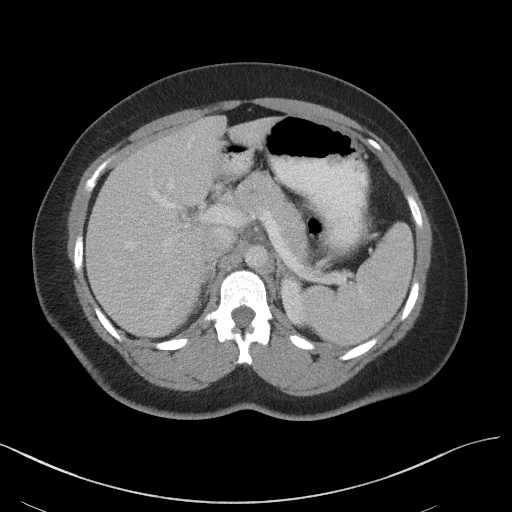
[im 86/98  soft-tissue]
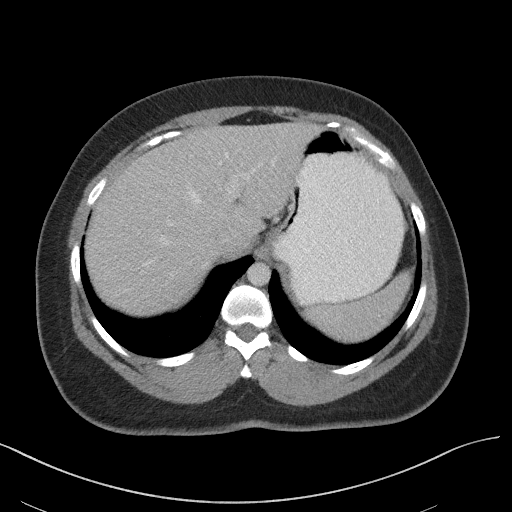
[im 94/98  soft-tissue]
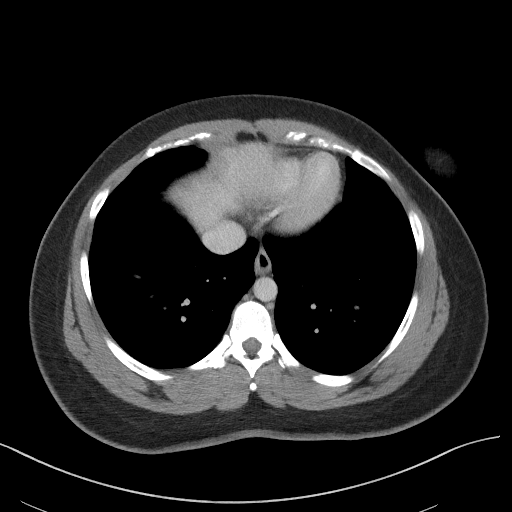

[Series 4: coronal st · coronal · 0.78mm/px · 3 of 108 slices shown]
[im 36/108  soft-tissue]
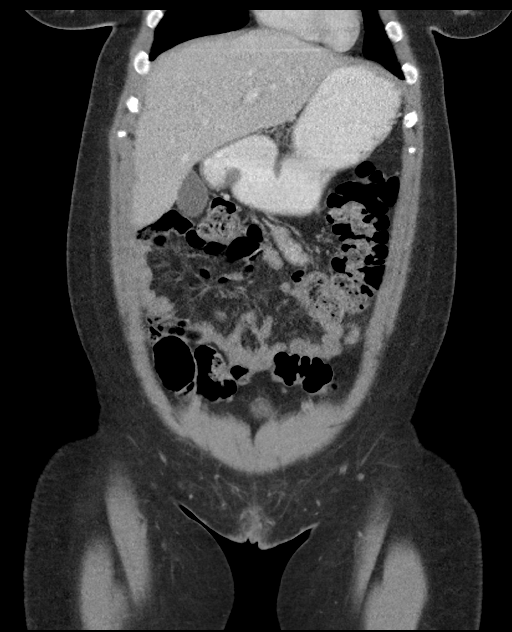
[im 48/108  soft-tissue]
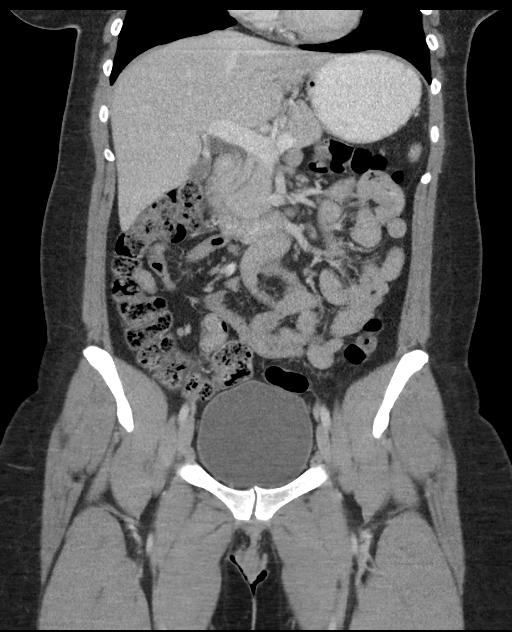
[im 60/108  soft-tissue]
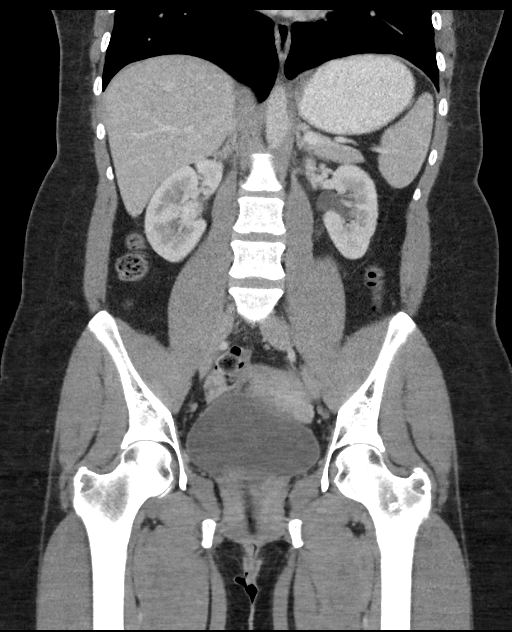

[16 of 46 positions shown; findings below may reference images not displayed]

FINDINGS: LOWER CHEST: Lung bases are clear. Included heart size is normal. No
pericardial effusion.

HEPATOBILIARY: Liver and gallbladder are normal.

PANCREAS: Normal.

SPLEEN: Normal.

ADRENALS/URINARY TRACT: Kidneys are orthotopic, demonstrating
symmetric enhancement. No nephrolithiasis, hydronephrosis or solid
renal masses. The unopacified ureters are normal in course and
caliber. Urinary bladder is well distended and unremarkable. Normal
adrenal glands.

STOMACH/BOWEL: The stomach, small and large bowel are normal in
course and caliber without inflammatory changes. Normal appendix.

VASCULAR/LYMPHATIC: Aortoiliac vessels are normal in course and
caliber. No lymphadenopathy by CT size criteria. Subcentimeter
mesenteric lymph nodes are likely reactive.

REPRODUCTIVE: Normal.

OTHER: No intraperitoneal free fluid or free air.

MUSCULOSKELETAL: Nonacute.  Small fat containing umbilical hernia.
IMPRESSION: No acute intra-abdominal or pelvic process ; normal spleen.

## 2018-10-06 HISTORY — PX: OTHER SURGICAL HISTORY: SHX169

## 2018-11-22 ENCOUNTER — Encounter: Payer: Self-pay | Admitting: *Deleted

## 2018-11-24 ENCOUNTER — Encounter: Payer: Self-pay | Admitting: Diagnostic Neuroimaging

## 2018-11-24 ENCOUNTER — Ambulatory Visit: Payer: Managed Care, Other (non HMO) | Admitting: Diagnostic Neuroimaging

## 2018-11-24 VITALS — BP 122/82 | HR 92 | Ht 65.0 in | Wt 209.0 lb

## 2018-11-24 DIAGNOSIS — G43109 Migraine with aura, not intractable, without status migrainosus: Secondary | ICD-10-CM | POA: Diagnosis not present

## 2018-11-24 MED ORDER — RIZATRIPTAN BENZOATE 10 MG PO TBDP: 10.0000 mg | ORAL_TABLET | ORAL | 11 refills | Status: DC | PRN

## 2018-11-24 MED ORDER — TOPIRAMATE 50 MG PO TABS: 50.0000 mg | ORAL_TABLET | Freq: Two times a day (BID) | ORAL | 12 refills | Status: DC

## 2018-11-24 NOTE — Patient Instructions (Signed)
MIGRAINE WITH AURA - start topiramate 50mg  at bedtime; after 1 week increase to twice a day; drink plenty of water  - start rizatriptan 10mg  as needed for breakthrough headache; may repeat x 1 after 2 hours; max 2 tabs per day or 8 per month  - To prevent or relieve headaches, try the following:  Cool Compress. Lie down and place a cool compress on your head.   Avoid headache triggers. If certain foods or odors seem to have triggered your migraines in the past, avoid them. A headache diary might help you identify triggers.   Include physical activity in your daily routine.   Manage stress. Find healthy ways to cope with the stressors, such as delegating tasks on your to-do list.   Practice relaxation techniques. Try deep breathing, yoga, massage and visualization.   Eat regularly. Eating regularly scheduled meals and maintaining a healthy diet might help prevent headaches. Also, drink plenty of fluids.   Follow a regular sleep schedule. Sleep deprivation might contribute to headaches  Consider biofeedback. With this mind-body technique, you learn to control certain bodily functions - such as muscle tension, heart rate and blood pressure - to prevent headaches or reduce headache pain.

## 2018-11-24 NOTE — Progress Notes (Signed)
[facility]  PATIENT: Brenda Mitchell DOB: 01-25-1986  REFERRING CLINICIAN: Zannie Cove HISTORY FROM: patient and mother  REASON FOR VISIT: new consult    HISTORICAL  CHIEF COMPLAINT:  Chief Complaint  Patient presents with  . Migraine    rm 7, New Pt, mom- Linda, "I have complex regional pain syndrome which causes migraines"    HISTORY OF PRESENT ILLNESS:   32 year old female here for evaluation of headaches.  2 months ago patient had onset of daily headaches, right frontal, right occipital, sometimes left frontal headaches.  She describes a vice grip sensation without throbbing.  She has nausea, photophobia, blurred vision, sees dark spots.  2016 patient fell down at work and had right hand and right foot injury, developing complex regional pain syndrome.  She is currently on Cymbalta and gabapentin.  Patient also has been having problems with memory loss, confusion, tremors, fatigue, insomnia.  Patient also has depression, anxiety, racing thoughts, suicidal thoughts, disinterest activities, cramps, aching muscles.   REVIEW OF SYSTEMS: Full 14 system review of systems performed and negative with exception of: temp changes blood in stool constipation eye pain.    ALLERGIES: Allergies  Allergen Reactions  . Other Other (See Comments)    Grass pollen  . Penicillins Swelling and Rash    Has patient had a PCN reaction causing immediate rash, facial/tongue/throat swelling, SOB or lightheadedness with hypotension: Yes Has patient had a PCN reaction causing severe rash involving mucus membranes or skin necrosis: No Has patient had a PCN reaction that required hospitalization: No Has patient had a PCN reaction occurring within the last 10 years: Yes If all of the above answers are "NO", then may proceed with Cephalosporin use.     HOME MEDICATIONS: Outpatient Medications Prior to Visit  Medication Sig Dispense Refill  . albuterol (VENTOLIN HFA) 108 (90  Base) MCG/ACT inhaler Inhale 1-2 puffs into the lungs every 4 (four) hours as needed. For wheezing/shortness of breath.    . Cholecalciferol (VITAMIN D3 PO) Take 2 tablets by mouth daily.    . DULoxetine (CYMBALTA) 30 MG capsule Take 60 mg by mouth daily.  3  . gabapentin (NEURONTIN) 300 MG capsule Take 300 mg by mouth 2 (two) times daily.  2  . ibuprofen (ADVIL,MOTRIN) 600 MG tablet 1 po  pc   every 6 hours for 5 days then as needed for pain 30 tablet 1  . Multiple Vitamin (MULTIVITAMIN WITH MINERALS) TABS tablet Take 1 tablet by mouth daily. One-A-Day    . oxyCODONE-acetaminophen (PERCOCET/ROXICET) 5-325 MG tablet Take 1 tablet every 6 hours for post operative pain 20 tablet 0  . ranitidine (ZANTAC) 150 MG tablet Take 1 tablet (150 mg total) by mouth 2 (two) times daily. (Patient taking differently: Take 150 mg by mouth 2 (two) times daily as needed for heartburn. ) 14 tablet 0  . TURMERIC PO Take 1,000 mg by mouth 2 (two) times daily.     No facility-administered medications prior to visit.     PAST MEDICAL HISTORY: Past Medical History:  Diagnosis Date  . Anemia   . Anxiety   . Asthma   . CRPS (complex regional pain syndrome type I)   . Daytime sleepiness    Epworth 14  . Depression   . Endometriosis   . Enlarged thyroid   . Erythema nodosum 2000   on legs  . Fracture of foot, closed    right  . Hemorrhoids    internal  . Hypercholesterolemia  diet controlled - no meds  . Migraine headache   . Mononucleosis 2018  . Paresthesia   . Seasonal allergic rhinitis     PAST SURGICAL HISTORY: Past Surgical History:  Procedure Laterality Date  . BILATERAL SALPINGECTOMY  12/12/2017  . CRYOTHERAPY     precancer cells  . CYSTOSCOPY N/A 12/12/2017   Procedure: CYSTOSCOPY;  Surgeon: Waymon Amato, MD;  Location: [facility];  Service: Gynecology;  Laterality: N/A;  . KNEE SURGERY Right    local and stichtes only  . LAPAROSCOPIC ASSISTED VAGINAL HYSTERECTOMY Bilateral 12/12/2017    Procedure: LAPAROSCOPIC ASSISTED VAGINAL HYSTERECTOMY WITH BILATERAL SALPINGECTOMY AND BIOPSIES OF POSTERIOR CUL DE [place];  Surgeon: Waymon Amato, MD;  Location: [facility];  Service: Gynecology;  Laterality: Bilateral;  3 Hours  . MOUTH SURGERY     polyp removed lower left   . rectal banding  10/2018  . WISDOM TOOTH EXTRACTION      FAMILY HISTORY: Family History  Problem Relation Age of Onset  . Hypertension Mother   . Thyroid disease Mother     SOCIAL HISTORY: Social History   Socioeconomic History  . Marital status: Single    Spouse name: Not on file  . Number of children: 0  . Years of education: Not on file  . Highest education level: Associate degree: occupational, Hotel manager, or vocational program  Occupational History    Comment: [employer]  Social Needs  . Financial resource strain: Not on file  . Food insecurity:    Worry: Not on file    Inability: Not on file  . Transportation needs:    Medical: Not on file    Non-medical: Not on file  Tobacco Use  . Smoking status: Former Smoker    Packs/day: 0.50    Years: 1.00    Pack years: 0.50    Types: Cigarettes    Last attempt to quit: 12/07/2003    Years since quitting: 14.9  . Smokeless tobacco: Never Used  Substance and Sexual Activity  . Alcohol use: Yes    Alcohol/week: 2.0 standard drinks    Types: 2 Glasses of wine per week    Comment: occassional  . Drug use: No  . Sexual activity: Not on file  Lifestyle  . Physical activity:    Days per week: Not on file    Minutes per session: Not on file  . Stress: Not on file  Relationships  . Social connections:    Talks on phone: Not on file    Gets together: Not on file    Attends religious service: Not on file    Active member of club or organization: Not on file    Attends meetings of clubs or organizations: Not on file    Relationship status: Not on file  . Intimate partner violence:    Fear of current or ex partner: Not on file    Emotionally abused: Not on file     Physically abused: Not on file    Forced sexual activity: Not on file  Other Topics Concern  . Not on file  Social History Narrative   Lives alone   Caffeine- coffee, 6 oz daily     PHYSICAL EXAM  GENERAL EXAM/CONSTITUTIONAL: Vitals:  Vitals:   11/24/18 0832  BP: 122/82  Pulse: 92  Weight: 209 lb (94.8 kg)  Height: 5\' 5"  (1.651 m)     Body mass index is 34.78 kg/m. Wt Readings from Last 3 Encounters:  11/24/18 209 lb (94.8 kg)  12/12/17 189 lb (85.7 kg)  12/08/17 189 lb 2 oz (85.8 kg)     Patient is in no distress; well developed, nourished and groomed; neck is supple  CARDIOVASCULAR:  Examination of carotid arteries is normal; no carotid bruits  Regular rate and rhythm, no murmurs  Examination of peripheral vascular system by observation and palpation is normal  EYES:  Ophthalmoscopic exam of optic discs and posterior segments is normal; no papilledema or hemorrhages  Visual Acuity Screening   Right eye Left eye Both eyes  Without correction:     With correction: 20/30 20/40      MUSCULOSKELETAL:  Gait, strength, tone, movements noted in Neurologic exam below  NEUROLOGIC: MENTAL STATUS:  No flowsheet data found.  awake, alert, oriented to person, place and time  recent and remote memory intact  normal attention and concentration  language fluent, comprehension intact, naming intact  fund of knowledge appropriate  CRANIAL NERVE:   2nd - no papilledema on fundoscopic exam; PHOTOSENSITIVE  2nd, 3rd, 4th, 6th - pupils equal and reactive to light, visual fields full to confrontation, extraocular muscles intact, no nystagmus  5th - facial sensation symmetric  7th - facial strength symmetric  8th - hearing intact  9th - palate elevates symmetrically, uvula midline  11th - shoulder shrug symmetric  12th - tongue protrusion midline  MOTOR:   normal bulk and tone, full strength in the BUE, BLE  SENSORY:   normal and symmetric to  light touch; INCR TEMP SENS IN RIGHT ARM; DECR VIB IN RIGHT ARM  COORDINATION:   finger-nose-finger, fine finger movements normal  REFLEXES:   deep tendon reflexes SLIGHTLY BRISK (RIGHT ARM GREATER THAN LEFT); POSITIVE SUPRAPATELLAR BILATERALLY  GAIT/STATION:   narrow based gait; able to walk tandem; romberg is negative     DIAGNOSTIC DATA (LABS, IMAGING, TESTING) - I reviewed patient records, labs, notes, testing and imaging myself where available.  Lab Results  Component Value Date   WBC 10.9 (H) 12/13/2017   HGB 11.7 (L) 12/13/2017   HCT 34.3 (L) 12/13/2017   MCV 81.7 12/13/2017   PLT 175 12/13/2017      Component Value Date/Time   NA 139 12/13/2017 0517   K 4.1 12/13/2017 0517   CL 107 12/13/2017 0517   CO2 25 12/13/2017 0517   GLUCOSE 96 12/13/2017 0517   BUN 7 12/13/2017 0517   CREATININE 0.76 12/13/2017 0517   CALCIUM 8.2 (L) 12/13/2017 0517   PROT 7.3 01/25/2017 1413   ALBUMIN 4.0 01/25/2017 1413   AST 17 01/25/2017 1413   ALT 18 01/25/2017 1413   ALKPHOS 29 (L) 01/25/2017 1413   BILITOT 0.6 01/25/2017 1413   GFRNONAA >60 12/13/2017 0517   GFRAA >60 12/13/2017 0517   No results found for: CHOL, HDL, LDLCALC, LDLDIRECT, TRIG, CHOLHDL No results found for: HGBA1C No results found for: VITAMINB12 No results found for: TSH   11/14/18 MRI brain [I reviewed images myself and agree with interpretation. -[facility]]  - normal     ASSESSMENT AND PLAN  32 y.o. year old female here with:  Dx:  1. Migraine with aura and without status migrainosus, not intractable      PLAN:   MIGRAINE WITH AURA - start topiramate 50mg  at bedtime; after 1 week increase to twice a day; drink plenty of water  - start rizatriptan 10mg  as needed for breakthrough headache; may repeat x 1 after 2 hours; max 2 tabs per day or 8 per month  - To prevent  or relieve headaches, try the following:  Cool Compress. Lie down and place a cool compress on your head.   Avoid headache  triggers. If certain foods or odors seem to have triggered your migraines in the past, avoid them. A headache diary might help you identify triggers.   Include physical activity in your daily routine.   Manage stress. Find healthy ways to cope with the stressors, such as delegating tasks on your to-do list.   Practice relaxation techniques. Try deep breathing, yoga, massage and visualization.   Eat regularly. Eating regularly scheduled meals and maintaining a healthy diet might help prevent headaches. Also, drink plenty of fluids.   Follow a regular sleep schedule. Sleep deprivation might contribute to headaches  Consider biofeedback. With this mind-body technique, you learn to control certain bodily functions - such as muscle tension, heart rate and blood pressure - to prevent headaches or reduce headache pain.  Meds ordered this encounter  Medications  . topiramate (TOPAMAX) 50 MG tablet    Sig: Take 1 tablet (50 mg total) by mouth 2 (two) times daily.    Dispense:  60 tablet    Refill:  12  . rizatriptan (MAXALT-MLT) 10 MG disintegrating tablet    Sig: Take 1 tablet (10 mg total) by mouth as needed for migraine. May repeat in 2 hours if needed    Dispense:  9 tablet    Refill:  11   Return in about 6 months (around 05/26/2019).    Penni Bombard, MD 91/79/1505, 6:97 AM Certified in Neurology, Neurophysiology and Neuroimaging  [facility] [address] [place], [place] [address] [phone]

## 2019-01-09 ENCOUNTER — Emergency Department (HOSPITAL_COMMUNITY)
Admission: EM | Admit: 2019-01-09 | Discharge: 2019-01-09 | Disposition: A | Discharge status: Home / Self Care | Payer: Managed Care, Other (non HMO) | Attending: Emergency Medicine | Admitting: Emergency Medicine

## 2019-01-09 ENCOUNTER — Encounter (HOSPITAL_COMMUNITY): Payer: Self-pay

## 2019-01-09 DIAGNOSIS — Z79899 Other long term (current) drug therapy: Secondary | ICD-10-CM | POA: Insufficient documentation

## 2019-01-09 DIAGNOSIS — G43909 Migraine, unspecified, not intractable, without status migrainosus: Secondary | ICD-10-CM

## 2019-01-09 DIAGNOSIS — J45909 Unspecified asthma, uncomplicated: Secondary | ICD-10-CM | POA: Diagnosis not present

## 2019-01-09 DIAGNOSIS — Z87891 Personal history of nicotine dependence: Secondary | ICD-10-CM | POA: Insufficient documentation

## 2019-01-09 MED ORDER — ONDANSETRON 4 MG PO TBDP
4.0000 mg | ORAL_TABLET | Freq: Once | ORAL | Status: AC
  Administered 2019-01-09: 4 mg via ORAL
  Filled 2019-01-09: qty 1

## 2019-01-09 MED ORDER — DIPHENHYDRAMINE HCL 25 MG PO CAPS
25.0000 mg | ORAL_CAPSULE | Freq: Once | ORAL | Status: AC
  Administered 2019-01-09: 25 mg via ORAL
  Filled 2019-01-09: qty 1

## 2019-01-09 MED ORDER — PROCHLORPERAZINE MALEATE 5 MG PO TABS
10.0000 mg | ORAL_TABLET | Freq: Once | ORAL | Status: AC
  Administered 2019-01-09: 10 mg via ORAL
  Filled 2019-01-09: qty 2

## 2019-01-09 MED ORDER — IBUPROFEN 800 MG PO TABS
800.0000 mg | ORAL_TABLET | Freq: Once | ORAL | Status: AC
Start: 2019-01-09 — End: 2019-01-09
  Administered 2019-01-09: 800 mg via ORAL
  Filled 2019-01-09: qty 1

## 2019-01-09 NOTE — ED Triage Notes (Addendum)
Pt presents for evaluation of L eye pain today with photosensitivity and dizziness. Pt reports she has pain condition causing migraines. This feels different than normal migraine because does not normally have sharp eye pain. No visual changes.

## 2019-01-09 NOTE — Discharge Instructions (Signed)
Medications that we gave you today included Compazine, Benadryl, high-dose ibuprofen and Zofran for nausea.  Please be aware that if your symptoms worsen or change including any focal neurologic problems such as weakness numbness difficulty walking or speaking then you should return to the emergency department immediately.  Your headache will likely gradually improve over the next 24 hours and I would encourage you to stay out of work, stay home in a cool dark room with minimal noise and light to help speed up your recovery.  Please follow-up with your doctor within the next several days for a recheck and to discuss ongoing headache management.

## 2019-01-09 NOTE — ED Provider Notes (Signed)
[facility] EMERGENCY DEPARTMENT Provider Note   CSN: [id number] Arrival date & time: 01/09/19  1239     History   Chief Complaint Chief Complaint  Patient presents with  . Migraine    HPI Brenda Mitchell is a 33 y.o. female.  HPI  The patient is a 33 year old female, she has a history of complex regional pain syndrome, and subsequently has had frequent migraine headaches in fact she states that she has headaches almost daily, constantly and is taking gabapentin and duloxetine on a daily basis, she also takes Drucilla Schmidt triptan however she states that she is only had this medication twice ever.  Today she reports that she went to work with her normal condition without any other complaints, while she was at work she started to develop increasing pain in her head including a sharp and stabbing pain behind one of her eyes associated with nausea photophobia phonophobia and then felt like she had her hands draw up bilaterally with a facial twisting.  She ports that she could not reach for her phone to call for help, eventually this improved and she was able to type a message on her keyboard to her manager who helped call an ambulance for her.  Since that time the patient has improved, she has not had any of her medications, she has focused on her breathing and is improving.  She still has significant pain behind her eye and a headache but denies the symptoms in her arms.  Her speech is clear, her family is at the bedside to corroborate the patient's story of chronic headaches.  She has not had these types of neurologic symptoms with her headaches in the past.  She has been evaluated by her providers in the past with neuro imaging and states it has been normal.  No fevers, chills, nausea, vomiting, diarrhea, abdominal pain, chest pain, swelling.  Past Medical History:  Diagnosis Date  . Anemia   . Anxiety   . Asthma   . CRPS (complex regional pain syndrome type I)   . Daytime  sleepiness    Epworth 14  . Depression   . Endometriosis   . Enlarged thyroid   . Erythema nodosum 2000   on legs  . Fracture of foot, closed    right  . Hemorrhoids    internal  . Hypercholesterolemia    diet controlled - no meds  . Migraine headache   . Mononucleosis 2018  . Paresthesia   . Seasonal allergic rhinitis     Patient Active Problem List   Diagnosis Date Noted  . Chronic pelvic pain in female 12/12/2017  . Wound check, abscess 08/07/2013  . Vaginal bleeding 08/20/2011  . Female pelvic pain 08/20/2011  . ERYTHEMA NODOSUM 05/01/2009  . ASTHMA 04/30/2009    Past Surgical History:  Procedure Laterality Date  . BILATERAL SALPINGECTOMY  12/12/2017  . CRYOTHERAPY     precancer cells  . CYSTOSCOPY N/A 12/12/2017   Procedure: CYSTOSCOPY;  Surgeon: Waymon Amato, MD;  Location: [facility];  Service: Gynecology;  Laterality: N/A;  . KNEE SURGERY Right    local and stichtes only  . LAPAROSCOPIC ASSISTED VAGINAL HYSTERECTOMY Bilateral 12/12/2017   Procedure: LAPAROSCOPIC ASSISTED VAGINAL HYSTERECTOMY WITH BILATERAL SALPINGECTOMY AND BIOPSIES OF POSTERIOR CUL DE [place];  Surgeon: Waymon Amato, MD;  Location: [facility];  Service: Gynecology;  Laterality: Bilateral;  3 Hours  . MOUTH SURGERY     polyp removed lower left   . rectal banding  10/2018  . WISDOM TOOTH EXTRACTION       OB History    Gravida  0   Para      Term      Preterm      AB      Living        SAB      TAB      Ectopic      Multiple      Live Births               Home Medications    Prior to Admission medications   Medication Sig Start Date End Date Taking? Authorizing Provider  albuterol (VENTOLIN HFA) 108 (90 Base) MCG/ACT inhaler Inhale 1-2 puffs into the lungs every 4 (four) hours as needed. For wheezing/shortness of breath. 01/06/15   [provider]  Cholecalciferol (VITAMIN D3 PO) Take 2 tablets by mouth daily.    [provider]  DULoxetine (CYMBALTA) 30 MG capsule  Take 60 mg by mouth daily. 11/19/17   [provider]  gabapentin (NEURONTIN) 300 MG capsule Take 300 mg by mouth 2 (two) times daily. 11/13/17   [provider]  ibuprofen (ADVIL,MOTRIN) 600 MG tablet 1 po  pc   every 6 hours for 5 days then as needed for pain 12/13/17   Earnstine Regal, PA-C  Multiple Vitamin (MULTIVITAMIN WITH MINERALS) TABS tablet Take 1 tablet by mouth daily. One-A-Day    [provider]  oxyCODONE-acetaminophen (PERCOCET/ROXICET) 5-325 MG tablet Take 1 tablet every 6 hours for post operative pain 12/13/17   Earnstine Regal, PA-C  ranitidine (ZANTAC) 150 MG tablet Take 1 tablet (150 mg total) by mouth 2 (two) times daily. Patient taking differently: Take 150 mg by mouth 2 (two) times daily as needed for heartburn.  01/25/17 12/02/18  Duffy Bruce, MD  rizatriptan (MAXALT-MLT) 10 MG disintegrating tablet Take 1 tablet (10 mg total) by mouth as needed for migraine. May repeat in 2 hours if needed 11/24/18   Penumalli, Earlean Polka, MD  topiramate (TOPAMAX) 50 MG tablet Take 1 tablet (50 mg total) by mouth 2 (two) times daily. 11/24/18   Penumalli, Earlean Polka, MD  TURMERIC PO Take 1,000 mg by mouth 2 (two) times daily.    [provider]    Family History Family History  Problem Relation Age of Onset  . Hypertension Mother   . Thyroid disease Mother     Social History Social History   Tobacco Use  . Smoking status: Former Smoker    Packs/day: 0.50    Years: 1.00    Pack years: 0.50    Types: Cigarettes    Last attempt to quit: 12/07/2003    Years since quitting: 15.1  . Smokeless tobacco: Never Used  Substance Use Topics  . Alcohol use: Yes    Alcohol/week: 2.0 standard drinks    Types: 2 Glasses of wine per week    Comment: occassional  . Drug use: No     Allergies   Other and Penicillins   Review of Systems Review of Systems  All other systems reviewed and are negative.    Physical Exam Updated Vital Signs BP 119/83 (BP  Location: Right Arm)   Pulse 81   Temp 98.2 F (36.8 C) (Oral)   Resp 16   SpO2 99%   Physical Exam Vitals signs and nursing note reviewed.  Constitutional:      General: She is not in acute distress.    Appearance: She  is well-developed.     Comments: Uncomfortable appearing, wearing sunglasses  HENT:     Head: Normocephalic and atraumatic.     Mouth/Throat:     Pharynx: No oropharyngeal exudate.  Eyes:     General: No scleral icterus.       Right eye: No discharge.        Left eye: No discharge.     Conjunctiva/sclera: Conjunctivae normal.     Pupils: Pupils are equal, round, and reactive to light.  Neck:     Musculoskeletal: Normal range of motion and neck supple.     Thyroid: No thyromegaly.     Vascular: No JVD.  Cardiovascular:     Rate and Rhythm: Normal rate and regular rhythm.     Heart sounds: Normal heart sounds. No murmur. No friction rub. No gallop.   Pulmonary:     Effort: Pulmonary effort is normal. No respiratory distress.     Breath sounds: Normal breath sounds. No wheezing or rales.  Abdominal:     General: Bowel sounds are normal. There is no distension.     Palpations: Abdomen is soft. There is no mass.     Tenderness: There is no abdominal tenderness.  Musculoskeletal: Normal range of motion.        General: No tenderness.  Lymphadenopathy:     Cervical: No cervical adenopathy.  Skin:    General: Skin is warm and dry.     Findings: No erythema or rash.  Neurological:     Mental Status: She is alert.     Coordination: Coordination normal.     Comments: The patient speech is clear, her memory is intact, she is able to give me exact details, she is able to move all 4 extremities including normal strength at the hips knees and ankles as well as normal grips bilaterally.  She has no facial asymmetry.  She has severe sensitivity to light but from what I can tell of her pupils and her conjunctive are there are clear equal and reactive.  There is no  discharge or facial asymmetry.  Psychiatric:        Behavior: Behavior normal.      ED Treatments / Results  Labs (all labs ordered are listed, but only abnormal results are displayed) Labs Reviewed - No data to display  EKG None  Radiology No results found.  Procedures Procedures (including critical care time)  Medications Ordered in ED Medications  prochlorperazine (COMPAZINE) tablet 10 mg (10 mg Oral Given 01/09/19 1614)  diphenhydrAMINE (BENADRYL) capsule 25 mg (25 mg Oral Given 01/09/19 1614)  ibuprofen (ADVIL,MOTRIN) tablet 800 mg (800 mg Oral Given 01/09/19 1614)  ondansetron (ZOFRAN-ODT) disintegrating tablet 4 mg (4 mg Oral Given 01/09/19 1614)     Initial Impression / Assessment and Plan / ED Course  I have reviewed the triage vital signs and the nursing notes.  Pertinent labs & imaging results that were available during my care of the patient were reviewed by me and considered in my medical decision making (see chart for details).     The patient's exam and history is consistent with a progressive migraine, possibly a complex migraine, she has had neuroimaging in the past, I think the best treatment at this time would be to focus on pain.  She refuses an IV because of her pain that she has with her regional pain syndrome thus she does endorse agreeing to oral medications.  She will take a dose of her home triptan and  I will give her additional Compazine and Benadryl.  Ibuprofen as well.  We will reexamine.  I do not see any neurologic symptoms nor is there any infectious symptoms to necessitate further evaluation with neuroimaging or lumbar puncture.  Pt states at 6:10 PM that she is back to her baseline low level headache - she is comfortable with d/c - pt stable appearing with clear speech and normal level of alertness, pt will be driven home by family.    Final Clinical Impressions(s) / ED Diagnoses   Final diagnoses:  Migraine without status migrainosus, not  intractable, unspecified migraine type      Noemi Chapel, MD 01/09/19 1811

## 2019-01-10 ENCOUNTER — Ambulatory Visit: Payer: Managed Care, Other (non HMO) | Admitting: Neurology

## 2019-02-09 ENCOUNTER — Ambulatory Visit: Payer: Managed Care, Other (non HMO) | Admitting: Diagnostic Neuroimaging

## 2019-02-09 ENCOUNTER — Telehealth: Payer: Self-pay | Admitting: Diagnostic Neuroimaging

## 2019-02-09 ENCOUNTER — Encounter

## 2019-02-09 ENCOUNTER — Encounter: Payer: Self-pay | Admitting: Diagnostic Neuroimaging

## 2019-02-09 VITALS — BP 121/79 | HR 106 | Ht 65.0 in | Wt 214.0 lb

## 2019-02-09 DIAGNOSIS — G894 Chronic pain syndrome: Secondary | ICD-10-CM

## 2019-02-09 DIAGNOSIS — G43109 Migraine with aura, not intractable, without status migrainosus: Secondary | ICD-10-CM | POA: Diagnosis not present

## 2019-02-09 NOTE — Telephone Encounter (Signed)
Pt wanted to let Dr. Leta Baptist know her pain specialist is Dr. Holley Raring with [facility]

## 2019-02-09 NOTE — Progress Notes (Signed)
[facility]  PATIENT: Brenda Mitchell DOB: 05/23/1986  REFERRING CLINICIAN: Zannie Cove HISTORY FROM: patient REASON FOR VISIT: follow up   HISTORICAL  CHIEF COMPLAINT:  Chief Complaint  Patient presents with  . Migraine    rm 6, ED FU, "only given med for nausea in ED, dr trying to decide if migraines are from my chronic pain, if topiramate/rizatriptan are conflicting with my cymbalta - dr prescribed Aimovig but insurance denied"    HISTORY OF PRESENT ILLNESS:   UPDATE (02/09/19, VRP): Since last visit, migraine were improved on TPX and rizatriptan. Freq is stable at daily; but severity is improved. Rizatriptan seems to help. However her pain clinic wanted her to try aimovig, but denied by insurance. Also was at ER recently for possible complicated migraine.   PRIOR HPI (11/24/18): 33 year old female here for evaluation of headaches.  2 months ago patient had onset of daily headaches, right frontal, right occipital, sometimes left frontal headaches.  She describes a vice grip sensation without throbbing.  She has nausea, photophobia, blurred vision, sees dark spots.  2016 patient fell down at work and had right hand and right foot injury, developing complex regional pain syndrome.  She is currently on Cymbalta and gabapentin.  Patient also has been having problems with memory loss, confusion, tremors, fatigue, insomnia.  Patient also has depression, anxiety, racing thoughts, suicidal thoughts, disinterest activities, cramps, aching muscles.   REVIEW OF SYSTEMS: Full 14 system review of systems performed and negative with exception of: dizziness HA numbness depression suicidal thoughts fatigue.   ALLERGIES: Allergies  Allergen Reactions  . Other Other (See Comments)    Grass pollen  . Penicillins Swelling and Rash    Has patient had a PCN reaction causing immediate rash, facial/tongue/throat swelling, SOB or lightheadedness with hypotension: Yes Has patient had  a PCN reaction causing severe rash involving mucus membranes or skin necrosis: No Has patient had a PCN reaction that required hospitalization: No Has patient had a PCN reaction occurring within the last 10 years: Yes If all of the above answers are "NO", then may proceed with Cephalosporin use.     HOME MEDICATIONS: Outpatient Medications Prior to Visit  Medication Sig Dispense Refill  . albuterol (VENTOLIN HFA) 108 (90 Base) MCG/ACT inhaler Inhale 1-2 puffs into the lungs every 4 (four) hours as needed. For wheezing/shortness of breath.    . Cholecalciferol (VITAMIN D3 PO) Take 2 tablets by mouth daily.    . Cyanocobalamin (VITAMIN B-12 PO) Take 1,000 mg by mouth daily.    . DULoxetine (CYMBALTA) 30 MG capsule Take 60 mg by mouth daily.  3  . gabapentin (NEURONTIN) 300 MG capsule Take 300 mg by mouth 2 (two) times daily.  2  . Multiple Vitamin (MULTIVITAMIN WITH MINERALS) TABS tablet Take 1 tablet by mouth daily. One-A-Day    . ranitidine (ZANTAC) 150 MG tablet Take 1 tablet (150 mg total) by mouth 2 (two) times daily. (Patient taking differently: Take 150 mg by mouth 2 (two) times daily as needed for heartburn. ) 14 tablet 0  . rizatriptan (MAXALT-MLT) 10 MG disintegrating tablet Take 1 tablet (10 mg total) by mouth as needed for migraine. May repeat in 2 hours if needed 9 tablet 11  . topiramate (TOPAMAX) 50 MG tablet Take 1 tablet (50 mg total) by mouth 2 (two) times daily. 60 tablet 12  . vitamin B-12 (CYANOCOBALAMIN) 1000 MCG tablet Take 1,000 mcg by mouth daily.    Marland Kitchen ibuprofen (ADVIL,MOTRIN) 600 MG  tablet 1 po  pc   every 6 hours for 5 days then as needed for pain (Patient not taking: Reported on 02/09/2019) 30 tablet 1  . oxyCODONE-acetaminophen (PERCOCET/ROXICET) 5-325 MG tablet Take 1 tablet every 6 hours for post operative pain (Patient not taking: Reported on 02/09/2019) 20 tablet 0  . TURMERIC PO Take 1,000 mg by mouth 2 (two) times daily.     No facility-administered medications  prior to visit.     PAST MEDICAL HISTORY: Past Medical History:  Diagnosis Date  . Anemia   . Anxiety   . Asthma   . CRPS (complex regional pain syndrome type I)   . CRPS (complex regional pain syndrome type I)   . Daytime sleepiness    [place] 14  . Depression   . Endometriosis   . Enlarged thyroid   . Erythema nodosum 2000   on legs  . Fracture of foot, closed    right  . Hemorrhoids    internal  . Hypercholesterolemia    diet controlled - no meds  . Migraine headache   . Mononucleosis 2018  . Paresthesia   . Seasonal allergic rhinitis     PAST SURGICAL HISTORY: Past Surgical History:  Procedure Laterality Date  . BILATERAL SALPINGECTOMY  12/12/2017  . CRYOTHERAPY     precancer cells  . CYSTOSCOPY N/A 12/12/2017   Procedure: CYSTOSCOPY;  Surgeon: Waymon Amato, MD;  Location: [facility];  Service: Gynecology;  Laterality: N/A;  . KNEE SURGERY Right    local and stichtes only  . LAPAROSCOPIC ASSISTED VAGINAL HYSTERECTOMY Bilateral 12/12/2017   Procedure: LAPAROSCOPIC ASSISTED VAGINAL HYSTERECTOMY WITH BILATERAL SALPINGECTOMY AND BIOPSIES OF POSTERIOR CUL DE [place];  Surgeon: Waymon Amato, MD;  Location: [facility];  Service: Gynecology;  Laterality: Bilateral;  3 Hours  . MOUTH SURGERY     polyp removed lower left   . rectal banding  10/2018  . WISDOM TOOTH EXTRACTION      FAMILY HISTORY: Family History  Problem Relation Age of Onset  . Hypertension Mother   . Thyroid disease Mother     SOCIAL HISTORY: Social History   Socioeconomic History  . Marital status: Single    Spouse name: Not on file  . Number of children: 0  . Years of education: Not on file  . Highest education level: Associate degree: occupational, Hotel manager, or vocational program  Occupational History    Comment: [employer]  Social Needs  . Financial resource strain: Not on file  . Food insecurity:    Worry: Not on file    Inability: Not on file  . Transportation needs:    Medical: Not on file     Non-medical: Not on file  Tobacco Use  . Smoking status: Former Smoker    Packs/day: 0.50    Years: 1.00    Pack years: 0.50    Types: Cigarettes    Last attempt to quit: 12/07/2003    Years since quitting: 15.1  . Smokeless tobacco: Never Used  Substance and Sexual Activity  . Alcohol use: Yes    Alcohol/week: 2.0 standard drinks    Types: 2 Glasses of wine per week    Comment: occassional  . Drug use: No  . Sexual activity: Not on file  Lifestyle  . Physical activity:    Days per week: Not on file    Minutes per session: Not on file  . Stress: Not on file  Relationships  . Social connections:    Talks on phone:  Not on file    Gets together: Not on file    Attends religious service: Not on file    Active member of club or organization: Not on file    Attends meetings of clubs or organizations: Not on file    Relationship status: Not on file  . Intimate partner violence:    Fear of current or ex partner: Not on file    Emotionally abused: Not on file    Physically abused: Not on file    Forced sexual activity: Not on file  Other Topics Concern  . Not on file  Social History Narrative   Lives alone   Caffeine- coffee, 6 oz daily     PHYSICAL EXAM  GENERAL EXAM/CONSTITUTIONAL: Vitals:  Vitals:   02/09/19 0854  BP: 121/79  Pulse: (!) 106  Weight: 214 lb (97.1 kg)  Height: 5\' 5"  (1.651 m)   Body mass index is 35.61 kg/m. Wt Readings from Last 3 Encounters:  02/09/19 214 lb (97.1 kg)  11/24/18 209 lb (94.8 kg)  12/12/17 189 lb (85.7 kg)    Patient is in no distress; well developed, nourished and groomed; neck is supple  CARDIOVASCULAR:  Examination of carotid arteries is normal; no carotid bruits  Regular rate and rhythm, no murmurs  Examination of peripheral vascular system by observation and palpation is normal  EYES:  Ophthalmoscopic exam of optic discs and posterior segments is normal; no papilledema or hemorrhages No exam data  present  MUSCULOSKELETAL:  Gait, strength, tone, movements noted in Neurologic exam below  NEUROLOGIC: MENTAL STATUS:  No flowsheet data found.  awake, alert, oriented to person, place and time  recent and remote memory intact  normal attention and concentration  language fluent, comprehension intact, naming intact  fund of knowledge appropriate  CRANIAL NERVE:   2nd - no papilledema on fundoscopic exam; PHOTOSENSITIVE  2nd, 3rd, 4th, 6th - pupils equal and reactive to light, visual fields full to confrontation, extraocular muscles intact, no nystagmus  5th - facial sensation symmetric  7th - facial strength symmetric  8th - hearing intact  9th - palate elevates symmetrically, uvula midline  11th - shoulder shrug symmetric  12th - tongue protrusion midline  MOTOR:   normal bulk and tone, full strength in the BUE, BLE  SENSORY:   normal and symmetric to light touch  COORDINATION:   finger-nose-finger, fine finger movements normal  REFLEXES:   deep tendon reflexes 1+ throughout   GAIT/STATION:   narrow based gait     DIAGNOSTIC DATA (LABS, IMAGING, TESTING) - I reviewed patient records, labs, notes, testing and imaging myself where available.  Lab Results  Component Value Date   WBC 10.9 (H) 12/13/2017   HGB 11.7 (L) 12/13/2017   HCT 34.3 (L) 12/13/2017   MCV 81.7 12/13/2017   PLT 175 12/13/2017      Component Value Date/Time   NA 139 12/13/2017 0517   K 4.1 12/13/2017 0517   CL 107 12/13/2017 0517   CO2 25 12/13/2017 0517   GLUCOSE 96 12/13/2017 0517   BUN 7 12/13/2017 0517   CREATININE 0.76 12/13/2017 0517   CALCIUM 8.2 (L) 12/13/2017 0517   PROT 7.3 01/25/2017 1413   ALBUMIN 4.0 01/25/2017 1413   AST 17 01/25/2017 1413   ALT 18 01/25/2017 1413   ALKPHOS 29 (L) 01/25/2017 1413   BILITOT 0.6 01/25/2017 1413   GFRNONAA >60 12/13/2017 0517   GFRAA >60 12/13/2017 0517   No results found for: CHOL, HDL,  LDLCALC, LDLDIRECT, TRIG,  CHOLHDL No results found for: HGBA1C No results found for: VITAMINB12 No results found for: TSH   11/14/18 MRI brain [I reviewed images myself and agree with interpretation. -VRP]  - normal    ASSESSMENT AND PLAN  33 y.o. year old female here with:  Dx:  1. Migraine with aura and without status migrainosus, not intractable   2. Chronic pain syndrome     PLAN:  MIGRAINE WITH AURA - continue topiramate 50mg  twice a day - continue rizatriptan 10mg  as needed for breakthrough headache; may repeat x 1 after 2 hours; max 2 tabs per day or 8 per month - may switch to aimovig per pain specialist if needed - follow up with pain specialist and psychiatry  Return for return to PCP and pain clinic. may return as needed pending symptoms    Penni Bombard, MD 01/12/349, 0:93 AM Certified in Neurology, Neurophysiology and Neuroimaging  [facility] [address], [place], [place] [address] [phone]

## 2019-02-09 NOTE — Patient Instructions (Signed)
MIGRAINE WITH AURA - continue topiramate 50mg  twice a day - continue rizatriptan 10mg  as needed for breakthrough headache; may repeat x 1 after 2 hours; max 2 tabs per day or 8 per month - may switch to aimovig per pain specialist if needed

## 2019-05-28 ENCOUNTER — Ambulatory Visit: Payer: Managed Care, Other (non HMO) | Admitting: Diagnostic Neuroimaging

## 2019-08-20 ENCOUNTER — Telehealth: Payer: Self-pay | Admitting: *Deleted

## 2019-08-20 NOTE — Telephone Encounter (Signed)
Pt called with concerns of medication reaction. Stated for 2-3 months she has noticed brain fog and stuttering. This has worsened to the point where people at work think she is "slow". She said she stuttered as a child but it went away and is now back. She said the brain fog is insane. She would like a call back with instructions. She was recently started on Gabapentin but was told by PCP it probably wasn't that. She is on Topiramate 50 mg BID. She stated ok to leave to details on VM if no answer. Call back # is [phone].

## 2019-08-20 NOTE — Telephone Encounter (Addendum)
Spoke with patient who stated she's had "brain fog x 3 months, getting worse". She is also stuttering again which interferes with her job. She is now averaging a migraine 1/x week, takes rizatriptan weekly for it. She stated her pain specialist at [facility] refered her back to PCP for pain control. She has  continued gabapentin, cymbalta.  The aimovig was not approved by insurance, and she's not sure she'd want to taking injections due to her CRPS. I advised will discuss with Dr Leta Baptist and call her back. Patient verbalized understanding, appreciation.

## 2019-08-21 NOTE — Telephone Encounter (Signed)
Pt called in again today asking if she could just stop the Topiramate. She said it took her 10 tries to leave a pt a message on the phone at her job. She would rather stop it. I advised against abruptly stopping the medication without an MD order and assured her I would send her request to Dr. Leta Baptist. She was very Patent attorney.

## 2019-08-21 NOTE — Telephone Encounter (Signed)
Reduce TPX to 50mg  daily x 1 week then stop. -VRP

## 2019-08-21 NOTE — Telephone Encounter (Signed)
I called the pt and advised her Dr. Leta Baptist said to reduce the Topamax to 50 mg daily x 1 week then stop. Pt verbalized understanding. She took one dose this morning. She will continue with one tablet daily and will stop next Tuesday. She verbalized appreciation. She will monitor her headaches and keep [place] informed.

## 2020-02-16 ENCOUNTER — Other Ambulatory Visit: Payer: Self-pay | Admitting: Diagnostic Neuroimaging

## 2020-07-06 DIAGNOSIS — B029 Zoster without complications: Secondary | ICD-10-CM

## 2020-07-06 HISTORY — DX: Zoster without complications: B02.9

## 2020-07-17 ENCOUNTER — Encounter: Payer: Self-pay | Admitting: *Deleted

## 2020-07-21 ENCOUNTER — Telehealth: Payer: Self-pay | Admitting: *Deleted

## 2020-07-21 ENCOUNTER — Ambulatory Visit: Payer: Self-pay | Admitting: Diagnostic Neuroimaging

## 2020-07-21 NOTE — Telephone Encounter (Signed)
Called patient and informed her Dr Leta Baptist is out of office today, flight was canceled. She stated she will call back to reschedule, needs late afternoon. I advised if phone staff can't find appt have them message me . Patient verbalized understanding, appreciation.

## 2020-08-12 ENCOUNTER — Encounter: Payer: Self-pay | Admitting: Diagnostic Neuroimaging

## 2020-08-12 ENCOUNTER — Other Ambulatory Visit: Payer: Self-pay

## 2020-08-12 ENCOUNTER — Ambulatory Visit: Payer: Managed Care, Other (non HMO) | Admitting: Diagnostic Neuroimaging

## 2020-08-12 VITALS — BP 123/80 | HR 107 | Ht 66.0 in | Wt 245.0 lb

## 2020-08-12 DIAGNOSIS — G43109 Migraine with aura, not intractable, without status migrainosus: Secondary | ICD-10-CM | POA: Diagnosis not present

## 2020-08-12 DIAGNOSIS — G894 Chronic pain syndrome: Secondary | ICD-10-CM | POA: Diagnosis not present

## 2020-08-12 MED ORDER — RIZATRIPTAN BENZOATE 10 MG PO TBDP: 10.0000 mg | ORAL_TABLET | ORAL | 11 refills | Status: DC | PRN

## 2020-08-12 NOTE — Progress Notes (Signed)
[facility]  PATIENT: Brenda Mitchell DOB: 1986-09-30  REFERRING CLINICIAN: Zannie Cove HISTORY FROM: patient REASON FOR VISIT: follow up   HISTORICAL  CHIEF COMPLAINT:  Chief Complaint  Patient presents with  . Pain    rm 7, "pain in hands and right foot since 2017, may have CTS"  . Migraine    "needs refill on rizatriptan, maybe get migraine 2 x month- Advil + Rizatriptan takes it away"    HISTORY OF PRESENT ILLNESS:   UPDATE (08/12/20, VRP): Since last visit, doing about the same. Referred by ortho for NCV, to evaluate bilateral upper extremity and right lower extremity pain issues. Symptoms date back to 2016 injury (patient fell and fractured right great toe).   Headaches are stable. Migraine are 2 per month. General headaches are 1 per week. Rizatriptan helping.   UPDATE (02/09/19, VRP): Since last visit, migraine were improved on TPX and rizatriptan. Freq is stable at daily; but severity is improved. Rizatriptan seems to help. However her pain clinic wanted her to try aimovig, but denied by insurance. Also was at ER recently for possible complicated migraine.   PRIOR HPI (11/24/18): 34 year old female here for evaluation of headaches.  2 months ago patient had onset of daily headaches, right frontal, right occipital, sometimes left frontal headaches.  She describes a vice grip sensation without throbbing.  She has nausea, photophobia, blurred vision, sees dark spots.  2016 patient fell down at work and had right hand and right foot injury, developing complex regional pain syndrome.  She is currently on Cymbalta and gabapentin.  Patient also has been having problems with memory loss, confusion, tremors, fatigue, insomnia.  Patient also has depression, anxiety, racing thoughts, suicidal thoughts, disinterest activities, cramps, aching muscles.   REVIEW OF SYSTEMS: Full 14 system review of systems performed and negative with exception of: dizziness HA numbness  depression suicidal thoughts fatigue.   ALLERGIES: Allergies  Allergen Reactions  . Other Other (See Comments)    Grass pollen, yeast, mushroom -cough, itching, eyes burn,water  . Yeast-Related Products Other (See Comments)    Eyes water, burn, increased mucus in lungs  . Penicillins Swelling and Rash    Has patient had a PCN reaction causing immediate rash, facial/tongue/throat swelling, SOB or lightheadedness with hypotension: Yes Has patient had a PCN reaction causing severe rash involving mucus membranes or skin necrosis: No Has patient had a PCN reaction that required hospitalization: No Has patient had a PCN reaction occurring within the last 10 years: Yes If all of the above answers are "NO", then may proceed with Cephalosporin use.     HOME MEDICATIONS: Outpatient Medications Prior to Visit  Medication Sig Dispense Refill  . albuterol (VENTOLIN HFA) 108 (90 Base) MCG/ACT inhaler Inhale 1-2 puffs into the lungs every 4 (four) hours as needed. For wheezing/shortness of breath.    . Cholecalciferol (VITAMIN D3 PO) Take 2 tablets by mouth daily.    . Cyanocobalamin (VITAMIN B-12 PO) Take 1,000 mg by mouth daily.    . DULoxetine (CYMBALTA) 30 MG capsule Take 60 mg by mouth daily.  3  . EPINEPHrine 0.3 mg/0.3 mL IJ SOAJ injection Inject 0.3 mg into the muscle as needed for anaphylaxis.    Marland Kitchen gabapentin (NEURONTIN) 600 MG tablet Take 600 mg by mouth 3 (three) times daily.    Marland Kitchen ibuprofen (ADVIL,MOTRIN) 600 MG tablet 1 po  pc   every 6 hours for 5 days then as needed for pain 30 tablet 1  .  Magnesium 200 MG TABS Take 200 mg by mouth every other day.    . Multiple Vitamin (MULTIVITAMIN WITH MINERALS) TABS tablet Take 1 tablet by mouth daily. One-A-Day    . rizatriptan (MAXALT-MLT) 10 MG disintegrating tablet Take 1 tablet (10 mg total) by mouth as needed for migraine. May repeat in 2 hours if needed 9 tablet 11  . TURMERIC PO Take 1,000 mg by mouth 2 (two) times daily.    Marland Kitchen gabapentin  (NEURONTIN) 300 MG capsule Take 300 mg by mouth 2 (two) times daily.  2  . oxyCODONE-acetaminophen (PERCOCET/ROXICET) 5-325 MG tablet Take 1 tablet every 6 hours for post operative pain (Patient not taking: Reported on 02/09/2019) 20 tablet 0  . ranitidine (ZANTAC) 150 MG tablet Take 1 tablet (150 mg total) by mouth 2 (two) times daily. (Patient taking differently: Take 150 mg by mouth 2 (two) times daily as needed for heartburn. ) 14 tablet 0  . topiramate (TOPAMAX) 50 MG tablet Take 1 tablet (50 mg total) by mouth 2 (two) times daily. 60 tablet 12   No facility-administered medications prior to visit.    PAST MEDICAL HISTORY: Past Medical History:  Diagnosis Date  . Anemia   . Anxiety   . Asthma   . Complex regional pain syndrome I   . CRPS (complex regional pain syndrome type I)   . Daytime sleepiness    [place] 14  . Depression   . Endometriosis   . Enlarged thyroid   . Erythema nodosum 2000   on legs  . Fracture of foot, closed    right  . Hemorrhoids    internal  . Hypercholesterolemia    diet controlled - no meds  . Migraine headache   . Mononucleosis 2018  . Paresthesia   . Seasonal allergic rhinitis   . Shingles 07/2020    PAST SURGICAL HISTORY: Past Surgical History:  Procedure Laterality Date  . BILATERAL SALPINGECTOMY  12/12/2017  . CRYOTHERAPY     precancer cells  . CYSTOSCOPY N/A 12/12/2017   Procedure: CYSTOSCOPY;  Surgeon: Waymon Amato, MD;  Location: [facility];  Service: Gynecology;  Laterality: N/A;  . KNEE SURGERY Right    local and stichtes only  . LAPAROSCOPIC ASSISTED VAGINAL HYSTERECTOMY Bilateral 12/12/2017   Procedure: LAPAROSCOPIC ASSISTED VAGINAL HYSTERECTOMY WITH BILATERAL SALPINGECTOMY AND BIOPSIES OF POSTERIOR CUL DE [place];  Surgeon: Waymon Amato, MD;  Location: [facility];  Service: Gynecology;  Laterality: Bilateral;  3 Hours  . MOUTH SURGERY     polyp removed lower left   . rectal banding  10/2018  . WISDOM TOOTH EXTRACTION      FAMILY  HISTORY: Family History  Problem Relation Age of Onset  . Hypertension Mother   . Thyroid disease Mother   . Arthritis Maternal Grandmother     SOCIAL HISTORY: Social History   Socioeconomic History  . Marital status: Married    Spouse name: Not on file  . Number of children: 0  . Years of education: Not on file  . Highest education level: Associate degree: occupational, Hotel manager, or vocational program  Occupational History    Comment: [employer]  Tobacco Use  . Smoking status: Former Smoker    Packs/day: 0.50    Years: 1.00    Pack years: 0.50    Types: Cigarettes    Quit date: 12/07/2003    Years since quitting: 16.6  . Smokeless tobacco: Never Used  Vaping Use  . Vaping Use: Former  . Quit date: 12/07/2015  Substance and Sexual Activity  . Alcohol use: Yes    Alcohol/week: 2.0 standard drinks    Types: 2 Glasses of wine per week    Comment: occassional  . Drug use: No  . Sexual activity: Not on file  Other Topics Concern  . Not on file  Social History Narrative   Lives with husband   Caffeine- coffee, 6 oz daily   Social Determinants of Health   Financial Resource Strain:   . Difficulty of Paying Living Expenses: Not on file  Food Insecurity:   . Worried About Charity fundraiser in the Last Year: Not on file  . Ran Out of Food in the Last Year: Not on file  Transportation Needs:   . Lack of Transportation (Medical): Not on file  . Lack of Transportation (Non-Medical): Not on file  Physical Activity:   . Days of Exercise per Week: Not on file  . Minutes of Exercise per Session: Not on file  Stress:   . Feeling of Stress : Not on file  Social Connections:   . Frequency of Communication with Friends and Family: Not on file  . Frequency of Social Gatherings with Friends and Family: Not on file  . Attends Religious Services: Not on file  . Active Member of Clubs or Organizations: Not on file  . Attends Archivist Meetings: Not on file  . Marital  Status: Not on file  Intimate Partner Violence:   . Fear of Current or Ex-Partner: Not on file  . Emotionally Abused: Not on file  . Physically Abused: Not on file  . Sexually Abused: Not on file     PHYSICAL EXAM  GENERAL EXAM/CONSTITUTIONAL: Vitals:  Vitals:   08/12/20 0822  BP: 123/80  Pulse: (!) 107  Weight: 245 lb (111.1 kg)  Height: 5\' 6"  (1.676 m)   Body mass index is 39.54 kg/m. Wt Readings from Last 3 Encounters:  08/12/20 245 lb (111.1 kg)  02/09/19 214 lb (97.1 kg)  11/24/18 209 lb (94.8 kg)    Patient is in no distress; well developed, nourished and groomed; neck is supple  CARDIOVASCULAR:  Examination of carotid arteries is normal; no carotid bruits  Regular rate and rhythm, no murmurs  Examination of peripheral vascular system by observation and palpation is normal  EYES:  Ophthalmoscopic exam of optic discs and posterior segments is normal; no papilledema or hemorrhages No exam data present  MUSCULOSKELETAL:  Gait, strength, tone, movements noted in Neurologic exam below  NEUROLOGIC: MENTAL STATUS:  No flowsheet data found.  awake, alert, oriented to person, place and time  recent and remote memory intact  normal attention and concentration  language fluent, comprehension intact, naming intact  fund of knowledge appropriate  CRANIAL NERVE:   2nd - no papilledema on fundoscopic exam  2nd, 3rd, 4th, 6th - pupils equal and reactive to light, visual fields full to confrontation, extraocular muscles intact, no nystagmus  5th - facial sensation symmetric  7th - facial strength symmetric  8th - hearing intact  9th - palate elevates symmetrically, uvula midline  11th - shoulder shrug symmetric  12th - tongue protrusion midline  MOTOR:   normal bulk and tone, full strength in the BUE, BLE  SENSORY:   normal and symmetric to light touch  COORDINATION:   finger-nose-finger, fine finger movements normal  REFLEXES:   deep  tendon reflexes 1+ throughout   GAIT/STATION:   narrow based gait; USING WALKER     DIAGNOSTIC DATA (  LABS, IMAGING, TESTING) - I reviewed patient records, labs, notes, testing and imaging myself where available.  Lab Results  Component Value Date   WBC 10.9 (H) 12/13/2017   HGB 11.7 (L) 12/13/2017   HCT 34.3 (L) 12/13/2017   MCV 81.7 12/13/2017   PLT 175 12/13/2017      Component Value Date/Time   NA 139 12/13/2017 0517   K 4.1 12/13/2017 0517   CL 107 12/13/2017 0517   CO2 25 12/13/2017 0517   GLUCOSE 96 12/13/2017 0517   BUN 7 12/13/2017 0517   CREATININE 0.76 12/13/2017 0517   CALCIUM 8.2 (L) 12/13/2017 0517   PROT 7.3 01/25/2017 1413   ALBUMIN 4.0 01/25/2017 1413   AST 17 01/25/2017 1413   ALT 18 01/25/2017 1413   ALKPHOS 29 (L) 01/25/2017 1413   BILITOT 0.6 01/25/2017 1413   GFRNONAA >60 12/13/2017 0517   GFRAA >60 12/13/2017 0517   No results found for: CHOL, HDL, LDLCALC, LDLDIRECT, TRIG, CHOLHDL No results found for: HGBA1C No results found for: VITAMINB12 No results found for: TSH   11/14/18 MRI brain [I reviewed images myself and agree with interpretation. -VRP]  - normal    ASSESSMENT AND PLAN  34 y.o. year old female here with:  Meds tried: topiramate  Dx:  1. Migraine with aura and without status migrainosus, not intractable   2. Chronic pain syndrome      PLAN:  BILATERAL HAND AND RIGHT FOOT PAIN / NUMBNESS - check EMG/NCS (rule out CTS, neuropathy issues)  Orders Placed This Encounter  Procedures  . NCV with EMG(electromyography)     MIGRAINE WITH AURA - continue rizatriptan 10mg  as needed for breakthrough headache; may repeat x 1 after 2 hours; max 2 tabs per day or 8 per month  Meds ordered this encounter  Medications  . rizatriptan (MAXALT-MLT) 10 MG disintegrating tablet    Sig: Take 1 tablet (10 mg total) by mouth as needed for migraine. May repeat in 2 hours if needed    Dispense:  9 tablet    Refill:  11   Return  in about 1 year (around 08/12/2021) for with NP (Amy Lomax).    Penni Bombard, MD 08/13/9479, 1:65 AM Certified in Neurology, Neurophysiology and Neuroimaging  [facility] [address] [place], [place] [address] [phone]

## 2020-09-01 ENCOUNTER — Ambulatory Visit: Payer: Self-pay | Admitting: Diagnostic Neuroimaging

## 2020-09-04 ENCOUNTER — Encounter (INDEPENDENT_AMBULATORY_CARE_PROVIDER_SITE_OTHER): Payer: Managed Care, Other (non HMO) | Admitting: Diagnostic Neuroimaging

## 2020-09-04 ENCOUNTER — Ambulatory Visit (INDEPENDENT_AMBULATORY_CARE_PROVIDER_SITE_OTHER): Payer: Managed Care, Other (non HMO) | Admitting: Diagnostic Neuroimaging

## 2020-09-04 DIAGNOSIS — Z0289 Encounter for other administrative examinations: Secondary | ICD-10-CM

## 2020-09-04 DIAGNOSIS — R531 Weakness: Secondary | ICD-10-CM | POA: Diagnosis not present

## 2020-09-04 DIAGNOSIS — G894 Chronic pain syndrome: Secondary | ICD-10-CM

## 2020-09-04 NOTE — Procedures (Signed)
[facility]  NCS (NERVE CONDUCTION STUDY) WITH EMG (ELECTROMYOGRAPHY) REPORT   STUDY DATE: 09/04/20 PATIENT NAME: Brenda Mitchell DOB: September 12, 1986 MRN: [id number]  ORDERING CLINICIAN: Andrey Spearman, MD   TECHNOLOGIST: Sherre Scarlet ELECTROMYOGRAPHER: Earlean Polka. Phillipa Morden, MD  CLINICAL INFORMATION: 34 year old female with weakness and pain.  FINDINGS: NERVE CONDUCTION STUDY:  Right median, right ulnar, right peroneal and right tibial motor responses are normal.  Right sural, right superficial peroneal, right median and right ulnar sensory responses are normal.  Right median ulnar transcarpal comparison study is normal.  Right ulnar and right tibial F-wave latency is normal.   NEEDLE ELECTROMYOGRAPHY:  Needle examination of right lower extremity is normal.   IMPRESSION:   Normal study.  No electrodiagnostic evidence of large fiber neuropathy or myopathy at this time.    INTERPRETING PHYSICIAN:  Penni Bombard, MD Certified in Neurology, Neurophysiology and Neuroimaging  [facility] [address], [place], [place] [address] [phone]  [facility]    Nerve / Sites Muscle Latency Ref. Amplitude Ref. Rel Amp Segments Distance Velocity Ref. Area    ms ms mV mV %  cm m/s m/s mVms  R Median - APB     Wrist APB 3.0 ?4.4 9.6 ?4.0 100 Wrist - APB 7   33.5     Upper arm APB 6.8  8.3  86.3 Upper arm - Wrist 22 58 ?49 28.8  R Ulnar - ADM     Wrist ADM 2.3 ?3.3 11.2 ?6.0 100 Wrist - ADM 7   36.4     B.Elbow ADM 5.4  10.4  93.2 B.Elbow - Wrist 21 68 ?49 36.3     A.Elbow ADM 6.8  10.0  95.9 A.Elbow - B.Elbow 10 68 ?49 35.9         A.Elbow - Wrist      R Peroneal - EDB     Ankle EDB 4.4 ?6.5 7.1 ?2.0 100 Ankle - EDB 9   22.0     Fib head EDB 9.7  6.7  93.7 Fib head - Ankle 27 51 ?44 22.5     Pop fossa EDB 11.6  6.6  98.4 Pop fossa - Fib head 10 52 ?44 20.2         Pop fossa - Ankle      R Tibial - AH     Ankle AH 5.1 ?5.8 9.4 ?4.0  100 Ankle - AH 9   18.6     Pop fossa AH 12.4  7.2  76.6 Pop fossa - Ankle 35 48 ?41 17.0             SNC    Nerve / Sites Rec. Site Peak Lat Ref.  Amp Ref. Segments Distance Peak Diff Ref.    ms ms V V  cm ms ms  R Sural - Ankle (Calf)     Calf Ankle 3.5 ?4.4 14 ?6 Calf - Ankle 14    R Superficial peroneal - Ankle     Lat leg Ankle 3.3 ?4.4 12 ?6 Lat leg - Ankle 14    R Median, Ulnar - Transcarpal comparison     Median Palm Wrist 1.6 ?2.2 108 ?35 Median Palm - Wrist 8       Ulnar Palm Wrist 1.8 ?2.2 20 ?12 Ulnar Palm - Wrist 8          Median Palm - Ulnar Palm  -0.2 ?0.4  R Median - Orthodromic (Dig II, Mid palm)  Dig II Wrist 2.5 ?3.4 15 ?10 Dig II - Wrist 13    R Ulnar - Orthodromic, (Dig V, Mid palm)     Dig V Wrist 2.5 ?3.1 9 ?5 Dig V - Wrist 9                 F  Wave    Nerve F Lat Ref.   ms ms  R Tibial - AH 47.9 ?56.0  R Ulnar - ADM 23.9 ?32.0         EMG Summary Table    Spontaneous MUAP Recruitment  Muscle IA Fib PSW Fasc Other Amp Dur. Poly Pattern  R. Vastus medialis Normal None None None _______ Normal Normal Normal Normal  R. Tibialis anterior Normal None None None _______ Normal Normal Normal Normal  R. Gastrocnemius (Medial head) Normal None None None _______ Normal Normal Normal Normal

## 2020-09-09 DIAGNOSIS — Z0289 Encounter for other administrative examinations: Secondary | ICD-10-CM

## 2020-09-10 ENCOUNTER — Encounter: Payer: Self-pay | Admitting: *Deleted

## 2020-09-10 ENCOUNTER — Telehealth: Payer: Self-pay | Admitting: *Deleted

## 2020-09-10 NOTE — Telephone Encounter (Signed)
Received [employer] form. Called patient and discussed details of what she needs. She stated she has to look at computer screen her entire shift of work. She  needs extra time off screen, requested 5 minutes every 2 hours. She gets two 15 minute breaks and one hour lunch. She also requested to use visor over computer screen and wear dark glasses as needed. I advised will work on forms today. Patient verbalized understanding, appreciation. 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

## 2020-09-10 NOTE — Telephone Encounter (Signed)
[employer] forms completed, signed and sent medical records for processing.

## 2020-09-11 ENCOUNTER — Telehealth: Payer: Self-pay | Admitting: Diagnostic Neuroimaging

## 2020-09-11 NOTE — Telephone Encounter (Signed)
Pt. Forms faxed to [employer].

## 2020-10-23 ENCOUNTER — Other Ambulatory Visit: Payer: Self-pay | Admitting: Diagnostic Neuroimaging

## 2020-10-23 ENCOUNTER — Telehealth: Payer: Self-pay | Admitting: Diagnostic Neuroimaging

## 2020-10-23 NOTE — Telephone Encounter (Signed)
refille duntil FU Feb 2022.

## 2020-10-23 NOTE — Telephone Encounter (Signed)
Pt called, had migraine for a week, have been taking over counter medication and rizatriptan twice a day. Could Dr. Leta Baptist prescribe me a medication for my migraine. My last appt he discontinued the migraine medication because my migraines has lessen. Would like a call from the nurse.

## 2020-10-23 NOTE — Telephone Encounter (Signed)
Called patient and advised Dr Stephannie Li will refill Topamax until she can see NP. Scheduled for Feb 23,2022 put her on wait list. Patient verbalized understanding, appreciation.

## 2020-10-23 NOTE — Telephone Encounter (Signed)
May consider TPX vs CGRP antagonist. Recommend NP follow up to discuss. -VRP

## 2020-10-27 NOTE — Telephone Encounter (Signed)
I will be happy to see her. TY.

## 2020-12-29 ENCOUNTER — Telehealth: Payer: Self-pay | Admitting: Diagnostic Neuroimaging

## 2020-12-29 NOTE — Telephone Encounter (Signed)
Pt called stating that she had a seizure on Saturday and the on call provider advised her to call today to see if she can be seen sooner. Please advise.

## 2020-12-29 NOTE — Telephone Encounter (Signed)
She called on Sunday and reported a seizure on 12-27-2020. Felt disoriented and had an experience of paralysis, right out of sleep. CD

## 2020-12-29 NOTE — Telephone Encounter (Signed)
Called patient who stated she thought she had a migraine, felt like normal migraine with auras but with sense of her body moving with facial contortions and body twitching. She stated her brain couldn't decide if she was asleep or awake. She called out to family member who told her her legs were straight and bed was not disheveled. She called on call MD who told her it sounded like either a hemiplegic migraine or sleep paralysis, and she needed EEG and labs done. She was told to call office today to schedule FU. I advised she has FU with NP on 01/28/21 for her migraines, will send to Dr Leta Baptist and let her know if she needs to see MD and if sooner. Patient verbalized understanding, appreciation.

## 2020-12-30 NOTE — Telephone Encounter (Signed)
Ok to come in sooner or video visit. -VRP

## 2020-12-30 NOTE — Telephone Encounter (Signed)
Called patient and advised Dr Leta Baptist wants her FU moved sooner, may be Brenda Mitchell. She stated Brenda Mitchell is fine; we scheduled for first available with her schedule. Advised she call sooner for any questions, problems. Patient verbalized understanding, appreciation.

## 2021-01-12 ENCOUNTER — Telehealth (INDEPENDENT_AMBULATORY_CARE_PROVIDER_SITE_OTHER): Payer: Managed Care, Other (non HMO) | Admitting: Diagnostic Neuroimaging

## 2021-01-12 DIAGNOSIS — G478 Other sleep disorders: Secondary | ICD-10-CM

## 2021-01-12 DIAGNOSIS — G43109 Migraine with aura, not intractable, without status migrainosus: Secondary | ICD-10-CM | POA: Diagnosis not present

## 2021-01-12 NOTE — Progress Notes (Signed)
[facility]  PATIENT: Brenda Mitchell DOB: [birth year]  REFERRING CLINICIAN: Zannie Cove HISTORY FROM: patient REASON FOR VISIT: follow up   HISTORICAL  CHIEF COMPLAINT:  Chief Complaint  Patient presents with  . Headache    HISTORY OF PRESENT ILLNESS:   UPDATE (01/12/21, VRP): Since last visit, doing well, except had event in Jan 2021 while coming out of sleep (3am), but couldn't tell if she was awake or dreaming. Couldn't move initially. Had some headaches. Then she had some twitching movements. Lasted 10-15 minute. Was able to call for help using her Teacher, music. Husband came in and helped her. Then was able to get up. Was diagnosed with COVID a few days later. Now back to baseline.    UPDATE (08/12/20, VRP): Since last visit, doing about the same. Referred by ortho for NCV, to evaluate bilateral upper extremity and right lower extremity pain issues. Symptoms date back to 2016 injury (patient fell and fractured right great toe).   Headaches are stable. Migraine are 2 per month. General headaches are 1 per week. Rizatriptan helping.   UPDATE (02/09/19, VRP): Since last visit, migraine were improved on TPX and rizatriptan. Freq is stable at daily; but severity is improved. Rizatriptan seems to help. However her pain clinic wanted her to try aimovig, but denied by insurance. Also was at ER recently for possible complicated migraine.   PRIOR HPI (11/24/18): 35 year old female here for evaluation of headaches.  2 months ago patient had onset of daily headaches, right frontal, right occipital, sometimes left frontal headaches.  She describes a vice grip sensation without throbbing.  She has nausea, photophobia, blurred vision, sees dark spots.  2016 patient fell down at work and had right hand and right foot injury, developing complex regional pain syndrome.  She is currently on Cymbalta and gabapentin.  Patient also has been having problems with memory loss, confusion,  tremors, fatigue, insomnia.  Patient also has depression, anxiety, racing thoughts, suicidal thoughts, disinterest activities, cramps, aching muscles.   REVIEW OF SYSTEMS: Full 14 system review of systems performed and negative with exception of: dizziness HA numbness depression suicidal thoughts fatigue.   ALLERGIES: Allergies  Allergen Reactions  . Other Other (See Comments)    Grass pollen, yeast, mushroom -cough, itching, eyes burn,water  . Yeast-Related Products Other (See Comments)    Eyes water, burn, increased mucus in lungs  . Penicillins Swelling and Rash    Has patient had a PCN reaction causing immediate rash, facial/tongue/throat swelling, SOB or lightheadedness with hypotension: Yes Has patient had a PCN reaction causing severe rash involving mucus membranes or skin necrosis: No Has patient had a PCN reaction that required hospitalization: No Has patient had a PCN reaction occurring within the last 10 years: Yes If all of the above answers are "NO", then may proceed with Cephalosporin use.     HOME MEDICATIONS: Outpatient Medications Prior to Visit  Medication Sig Dispense Refill  . albuterol (VENTOLIN HFA) 108 (90 Base) MCG/ACT inhaler Inhale 1-2 puffs into the lungs every 4 (four) hours as needed. For wheezing/shortness of breath.    . Cholecalciferol (VITAMIN D3 PO) Take 2 tablets by mouth daily.    . Cyanocobalamin (VITAMIN B-12 PO) Take 1,000 mg by mouth daily.    . DULoxetine (CYMBALTA) 30 MG capsule Take 60 mg by mouth daily.  3  . EPINEPHrine 0.3 mg/0.3 mL IJ SOAJ injection Inject 0.3 mg into the muscle as needed for anaphylaxis.    Marland Kitchen gabapentin (NEURONTIN)  600 MG tablet Take 600 mg by mouth 3 (three) times daily.    Marland Kitchen ibuprofen (ADVIL,MOTRIN) 600 MG tablet 1 po  pc   every 6 hours for 5 days then as needed for pain 30 tablet 1  . Magnesium 200 MG TABS Take 200 mg by mouth every other day.    . Multiple Vitamin (MULTIVITAMIN WITH MINERALS) TABS tablet Take 1  tablet by mouth daily. One-A-Day    . rizatriptan (MAXALT-MLT) 10 MG disintegrating tablet Take 1 tablet (10 mg total) by mouth as needed for migraine. May repeat in 2 hours if needed 9 tablet 11  . topiramate (TOPAMAX) 50 MG tablet TAKE 1 TABLET BY MOUTH TWICE DAILY 60 tablet 4  . TURMERIC PO Take 1,000 mg by mouth 2 (two) times daily.     No facility-administered medications prior to visit.    PAST MEDICAL HISTORY: Past Medical History:  Diagnosis Date  . Anemia   . Anxiety   . Asthma   . Complex regional pain syndrome I   . CRPS (complex regional pain syndrome type I)   . Daytime sleepiness    Epworth 14  . Depression   . Endometriosis   . Enlarged thyroid   . Erythema nodosum 2000   on legs  . Fracture of foot, closed    right  . Hemorrhoids    internal  . Hypercholesterolemia    diet controlled - no meds  . Migraine headache   . Mononucleosis 2018  . Paresthesia   . Seasonal allergic rhinitis   . Shingles 07/2020    PAST SURGICAL HISTORY: Past Surgical History:  Procedure Laterality Date  . BILATERAL SALPINGECTOMY  12/12/2017  . CRYOTHERAPY     precancer cells  . CYSTOSCOPY N/A 12/12/2017   Procedure: CYSTOSCOPY;  Surgeon: Waymon Amato, MD;  Location: [facility];  Service: Gynecology;  Laterality: N/A;  . KNEE SURGERY Right    local and stichtes only  . LAPAROSCOPIC ASSISTED VAGINAL HYSTERECTOMY Bilateral 12/12/2017   Procedure: LAPAROSCOPIC ASSISTED VAGINAL HYSTERECTOMY WITH BILATERAL SALPINGECTOMY AND BIOPSIES OF POSTERIOR CUL DE [place];  Surgeon: Waymon Amato, MD;  Location: [facility];  Service: Gynecology;  Laterality: Bilateral;  3 Hours  . MOUTH SURGERY     polyp removed lower left   . rectal banding  10/2018  . WISDOM TOOTH EXTRACTION      FAMILY HISTORY: Family History  Problem Relation Age of Onset  . Hypertension Mother   . Thyroid disease Mother   . Arthritis Maternal Grandmother     SOCIAL HISTORY: Social History   Socioeconomic History  . Marital  status: Married    Spouse name: Not on file  . Number of children: 0  . Years of education: Not on file  . Highest education level: Associate degree: occupational, Hotel manager, or vocational program  Occupational History    Comment: [employer]  Tobacco Use  . Smoking status: Former Smoker    Packs/day: 0.50    Years: 1.00    Pack years: 0.50    Types: Cigarettes    Quit date: 12/07/2003    Years since quitting: 17.1  . Smokeless tobacco: Never Used  Vaping Use  . Vaping Use: Former  . Quit date: 12/07/2015  Substance and Sexual Activity  . Alcohol use: Yes    Alcohol/week: 2.0 standard drinks    Types: 2 Glasses of wine per week    Comment: occassional  . Drug use: No  . Sexual activity: Not on file  Other  Topics Concern  . Not on file  Social History Narrative   Lives with husband   Caffeine- coffee, 6 oz daily   Social Determinants of Health   Financial Resource Strain: Not on file  Food Insecurity: Not on file  Transportation Needs: Not on file  Physical Activity: Not on file  Stress: Not on file  Social Connections: Not on file  Intimate Partner Violence: Not on file     PHYSICAL EXAM  Phone visit   DIAGNOSTIC DATA (LABS, IMAGING, TESTING) - I reviewed patient records, labs, notes, testing and imaging myself where available.  Lab Results  Component Value Date   WBC 10.9 (H) 12/13/2017   HGB 11.7 (L) 12/13/2017   HCT 34.3 (L) 12/13/2017   MCV 81.7 12/13/2017   PLT 175 12/13/2017      Component Value Date/Time   NA 139 12/13/2017 0517   K 4.1 12/13/2017 0517   CL 107 12/13/2017 0517   CO2 25 12/13/2017 0517   GLUCOSE 96 12/13/2017 0517   BUN 7 12/13/2017 0517   CREATININE 0.76 12/13/2017 0517   CALCIUM 8.2 (L) 12/13/2017 0517   PROT 7.3 01/25/2017 1413   ALBUMIN 4.0 01/25/2017 1413   AST 17 01/25/2017 1413   ALT 18 01/25/2017 1413   ALKPHOS 29 (L) 01/25/2017 1413   BILITOT 0.6 01/25/2017 1413   GFRNONAA >60 12/13/2017 0517   GFRAA >60 12/13/2017  0517   No results found for: CHOL, HDL, LDLCALC, LDLDIRECT, TRIG, CHOLHDL No results found for: HGBA1C No results found for: VITAMINB12 No results found for: TSH   11/14/18 MRI brain [I reviewed images myself and agree with interpretation. -VRP]  - normal    ASSESSMENT AND PLAN  35 y.o. year old female here with:  Meds tried: topiramate  Dx:  1. Migraine with aura and without status migrainosus, not intractable   2. Sleep paralysis      Virtual Visit via Telephone Note  I connected with@ on 01/12/21 at  1:00 PM EST by telephone and verified that I am speaking with the correct person using two identifiers.   I discussed the limitations, risks, security and privacy concerns of performing an evaluation and management service by telephone and the availability of in person appointments. I also discussed with the patient that there may be a patient responsible charge related to this service. The patient expressed understanding and agreed to proceed. Patient is at home and I am at the office.  I discussed the assessment and treatment plan with the patient. The patient was provided an opportunity to ask questions and all were answered. The patient agreed with the plan and demonstrated an understanding of the instructions.   The patient was advised to call back or seek an in-person evaluation if the symptoms worsen or if the condition fails to improve as anticipated.  I provided 15 minutes of non-face-to-face time during this encounter.   PLAN:  MIGRAINE WITH AURA - continue topiramate 50mg  twice a day  - continue rizatriptan 10mg  as needed for breakthrough headache; may repeat x 1 after 2 hours; max 2 tabs per day or 8 per month  SLEEP PARALYSIS + hypnopompic jerks (only 1 event in Jan 2022) - in setting of COVID infx; not likely seizures; monitor   Return in about 6 months (around 07/12/2021).    Penni Bombard, MD 03/11/2702, 5:00 PM Certified in Neurology,  Neurophysiology and Neuroimaging  [facility] [address], [place], [place] [address] [phone]

## 2021-01-28 ENCOUNTER — Ambulatory Visit: Payer: Self-pay | Admitting: Family Medicine

## 2021-03-17 ENCOUNTER — Other Ambulatory Visit: Payer: Self-pay | Admitting: Diagnostic Neuroimaging

## 2021-08-12 ENCOUNTER — Ambulatory Visit: Payer: Managed Care, Other (non HMO) | Admitting: Family Medicine

## 2021-08-13 ENCOUNTER — Other Ambulatory Visit: Payer: Self-pay | Admitting: Diagnostic Neuroimaging

## 2021-09-16 ENCOUNTER — Encounter: Payer: Self-pay | Admitting: *Deleted

## 2021-09-21 ENCOUNTER — Other Ambulatory Visit: Payer: Self-pay

## 2021-09-21 ENCOUNTER — Encounter: Payer: Self-pay | Admitting: Diagnostic Neuroimaging

## 2021-09-21 ENCOUNTER — Ambulatory Visit: Payer: Managed Care, Other (non HMO) | Admitting: Diagnostic Neuroimaging

## 2021-09-21 VITALS — BP 122/81 | HR 109 | Ht 63.0 in | Wt 230.6 lb

## 2021-09-21 DIAGNOSIS — R531 Weakness: Secondary | ICD-10-CM

## 2021-09-21 DIAGNOSIS — R251 Tremor, unspecified: Secondary | ICD-10-CM

## 2021-09-21 MED ORDER — RIZATRIPTAN BENZOATE 10 MG PO TBDP: ORAL_TABLET | ORAL | 11 refills | Status: DC

## 2021-09-21 MED ORDER — TOPIRAMATE 50 MG PO TABS: 50.0000 mg | ORAL_TABLET | Freq: Two times a day (BID) | ORAL | 4 refills | Status: DC

## 2021-09-21 NOTE — Progress Notes (Signed)
[facility]  PATIENT: Brenda Mitchell DOB: May 28, 1986  REFERRING CLINICIAN: Zannie Cove HISTORY FROM: patient REASON FOR VISIT: follow up   HISTORICAL  CHIEF COMPLAINT:  Chief Complaint  Patient presents with   Complex regional pain syndrome    Rm 7 New Issue "saw ortho, had injection"    Migraine    HISTORY OF PRESENT ILLNESS:   UPDATE (09/21/21, VRP): Since last visit, here to discuss her dx of CRPS from 2017 injury. EMG was normal. NM bone scan was normal. Has not been able to exercise that much. HA are stable with rizatriptan.  UPDATE (01/12/21, VRP): Since last visit, doing well, except had event in Jan 2021 while coming out of sleep (3am), but couldn't tell if she was awake or dreaming. Couldn't move initially. Had some headaches. Then she had some twitching movements. Lasted 10-15 minute. Was able to call for help using her Teacher, music. Husband came in and helped her. Then was able to get up. Was diagnosed with COVID a few days later. Now back to baseline.    UPDATE (08/12/20, VRP): Since last visit, doing about the same. Referred by ortho for NCV, to evaluate bilateral upper extremity and right lower extremity pain issues. Symptoms date back to 2016 injury (patient fell and fractured right great toe).   Headaches are stable. Migraine are 2 per month. General headaches are 1 per week. Rizatriptan helping.   UPDATE (02/09/19, VRP): Since last visit, migraine were improved on TPX and rizatriptan. Freq is stable at daily; but severity is improved. Rizatriptan seems to help. However her pain clinic wanted her to try aimovig, but denied by insurance. Also was at ER recently for possible complicated migraine.   PRIOR HPI (11/24/18): 35 year old female here for evaluation of headaches.  2 months ago patient had onset of daily headaches, right frontal, right occipital, sometimes left frontal headaches.  She describes a vice grip sensation without throbbing.  She has  nausea, photophobia, blurred vision, sees dark spots.  2016 patient fell down at work and had right hand and right foot injury, developing complex regional pain syndrome.  She is currently on Cymbalta and gabapentin.  Patient also has been having problems with memory loss, confusion, tremors, fatigue, insomnia.  Patient also has depression, anxiety, racing thoughts, suicidal thoughts, disinterest activities, cramps, aching muscles.   REVIEW OF SYSTEMS: Full 14 system review of systems performed and negative with exception of: dizziness HA numbness depression suicidal thoughts fatigue.   ALLERGIES: Allergies  Allergen Reactions   Mushroom Extract Complex Nausea Only    Other reaction(s): Cough, Eye Swelling, Wheezing   Other Other (See Comments)    Grass pollen, yeast, mushroom -cough, itching, eyes burn,water   Yeast-Related Products Other (See Comments)    Eyes water, burn, increased mucus in lungs   Penicillins Swelling and Rash    Has patient had a PCN reaction causing immediate rash, facial/tongue/throat swelling, SOB or lightheadedness with hypotension: Yes Has patient had a PCN reaction causing severe rash involving mucus membranes or skin necrosis: No Has patient had a PCN reaction that required hospitalization: No Has patient had a PCN reaction occurring within the last 10 years: Yes If all of the above answers are "NO", then may proceed with Cephalosporin use.     HOME MEDICATIONS: Outpatient Medications Prior to Visit  Medication Sig Dispense Refill   albuterol (VENTOLIN HFA) 108 (90 Base) MCG/ACT inhaler Inhale 1-2 puffs into the lungs every 4 (four) hours as needed. For wheezing/shortness  of breath.     Cholecalciferol (VITAMIN D3 PO) Take 2 tablets by mouth daily.     Cyanocobalamin (VITAMIN B-12 PO) Take 1,000 mg by mouth daily.     DULoxetine (CYMBALTA) 30 MG capsule Take 60 mg by mouth daily.  3   EPINEPHrine 0.3 mg/0.3 mL IJ SOAJ injection Inject 0.3 mg into the  muscle as needed for anaphylaxis.     fluticasone (FLONASE) 50 MCG/ACT nasal spray Place into both nostrils daily.     gabapentin (NEURONTIN) 600 MG tablet Take 600 mg by mouth 3 (three) times daily.     ibuprofen (ADVIL,MOTRIN) 600 MG tablet 1 po  pc   every 6 hours for 5 days then as needed for pain 30 tablet 1   Magnesium 200 MG TABS Take 200 mg by mouth every other day.     Multiple Vitamin (MULTIVITAMIN WITH MINERALS) TABS tablet Take 1 tablet by mouth daily. One-A-Day     omeprazole (PRILOSEC) 40 MG capsule Take 40 mg by mouth daily.     rizatriptan (MAXALT-MLT) 10 MG disintegrating tablet DISSOLVE 1 TABLET ON THE TONGUE AS NEEDED FOR MIGRAINE. MAY REPEAT IN 2 HOURS AS NEEDED 9 tablet 11   topiramate (TOPAMAX) 50 MG tablet TAKE 1 TABLET BY MOUTH TWICE DAILY 60 tablet 4   TURMERIC PO Take 1,000 mg by mouth 2 (two) times daily.     No facility-administered medications prior to visit.    PAST MEDICAL HISTORY: Past Medical History:  Diagnosis Date   Anemia    Anxiety    Asthma    Complex regional pain syndrome I    CRPS (complex regional pain syndrome type I)    Daytime sleepiness    Epworth 14   Depression    Endometriosis    Enlarged thyroid    Erythema nodosum 2000   on legs   Fracture of foot, closed    right   Gastritis    Goiter    Hemorrhoids    internal   Hypercholesterolemia    diet controlled - no meds   Migraine headache    Mononucleosis 2018   Paresthesia    Seasonal allergic rhinitis    Shingles 07/2020    PAST SURGICAL HISTORY: Past Surgical History:  Procedure Laterality Date   BILATERAL SALPINGECTOMY  12/12/2017   CRYOTHERAPY     precancer cells   CYSTOSCOPY N/A 12/12/2017   Procedure: CYSTOSCOPY;  Surgeon: Waymon Amato, MD;  Location: [facility];  Service: Gynecology;  Laterality: N/A;   KNEE SURGERY Right    local and stichtes only   LAPAROSCOPIC ASSISTED VAGINAL HYSTERECTOMY Bilateral 12/12/2017   Procedure: LAPAROSCOPIC ASSISTED VAGINAL HYSTERECTOMY  WITH BILATERAL SALPINGECTOMY AND BIOPSIES OF POSTERIOR CUL DE [place];  Surgeon: Waymon Amato, MD;  Location: [facility];  Service: Gynecology;  Laterality: Bilateral;  3 Hours   MOUTH SURGERY     polyp removed lower left    rectal banding  10/2018   WISDOM TOOTH EXTRACTION      FAMILY HISTORY: Family History  Problem Relation Age of Onset   Other Mother        irregular heart rthyme   Hypertension Mother    Thyroid disease Mother    Stroke Father    Atrial fibrillation Father    Arthritis Maternal Grandmother    Heart attack Maternal Uncle     SOCIAL HISTORY: Social History   Socioeconomic History   Marital status: Married    Spouse name: Not on file  Number of children: 0   Years of education: Not on file   Highest education level: Associate degree: occupational, technical, or vocational program  Occupational History    Comment: [employer]  Tobacco Use   Smoking status: Former    Packs/day: 0.50    Years: 1.00    Pack years: 0.50    Types: Cigarettes    Quit date: 12/07/2003    Years since quitting: 17.8   Smokeless tobacco: Never  Vaping Use   Vaping Use: Former   Quit date: 12/07/2015  Substance and Sexual Activity   Alcohol use: Yes    Alcohol/week: 2.0 standard drinks    Types: 2 Glasses of wine per week    Comment: occassional   Drug use: No   Sexual activity: Not on file  Other Topics Concern   Not on file  Social History Narrative   Lives with husband   Caffeine- coffee, 6 oz daily   Social Determinants of Health   Financial Resource Strain: Not on file  Food Insecurity: Not on file  Transportation Needs: Not on file  Physical Activity: Not on file  Stress: Not on file  Social Connections: Not on file  Intimate Partner Violence: Not on file     PHYSICAL EXAM  GENERAL EXAM/CONSTITUTIONAL: Vitals:  Vitals:   09/21/21 1632  BP: 122/81  Pulse: (!) 109  Weight: 230 lb 9.6 oz (104.6 kg)  Height: 5\' 3"  (1.6 m)   Body mass index is 40.85 kg/m. Wt  Readings from Last 3 Encounters:  09/21/21 230 lb 9.6 oz (104.6 kg)  08/12/20 245 lb (111.1 kg)  02/09/19 214 lb (97.1 kg)   Patient is in no distress; well developed, nourished and groomed; neck is supple  CARDIOVASCULAR: Examination of carotid arteries is normal; no carotid bruits Regular rate and rhythm, no murmurs Examination of peripheral vascular system by observation and palpation is normal  EYES: Ophthalmoscopic exam of optic discs and posterior segments is normal; no papilledema or hemorrhages No results found.  MUSCULOSKELETAL: Gait, strength, tone, movements noted in Neurologic exam below  NEUROLOGIC: MENTAL STATUS:  No flowsheet data found. awake, alert, oriented to person, place and time recent and remote memory intact normal attention and concentration language fluent, comprehension intact, naming intact fund of knowledge appropriate  CRANIAL NERVE:  2nd - no papilledema on fundoscopic exam 2nd, 3rd, 4th, 6th - pupils equal and reactive to light, visual fields full to confrontation, extraocular muscles intact, no nystagmus 5th - facial sensation symmetric 7th - facial strength symmetric 8th - hearing intact 9th - palate elevates symmetrically, uvula midline 11th - shoulder shrug symmetric 12th - tongue protrusion midline  MOTOR:  normal bulk and tone, full strength in the BUE, BLE FLUCTUATING WEAKNESS IN BUE AND RLE  SENSORY:  normal and symmetric to light touch, temperature, vibration  COORDINATION:  finger-nose-finger, fine finger movements SLOW  REFLEXES:  deep tendon reflexes SLIGHTLY BRISK IN UPPER EXT and symmetric  GAIT/STATION:  narrow based gait    DIAGNOSTIC DATA (LABS, IMAGING, TESTING) - I reviewed patient records, labs, notes, testing and imaging myself where available.  Lab Results  Component Value Date   WBC 10.9 (H) 12/13/2017   HGB 11.7 (L) 12/13/2017   HCT 34.3 (L) 12/13/2017   MCV 81.7 12/13/2017   PLT 175 12/13/2017       Component Value Date/Time   NA 139 12/13/2017 0517   K 4.1 12/13/2017 0517   CL 107 12/13/2017 0517   CO2 25 12/13/2017  0517   GLUCOSE 96 12/13/2017 0517   BUN 7 12/13/2017 0517   CREATININE 0.76 12/13/2017 0517   CALCIUM 8.2 (L) 12/13/2017 0517   PROT 7.3 01/25/2017 1413   ALBUMIN 4.0 01/25/2017 1413   AST 17 01/25/2017 1413   ALT 18 01/25/2017 1413   ALKPHOS 29 (L) 01/25/2017 1413   BILITOT 0.6 01/25/2017 1413   GFRNONAA >60 12/13/2017 0517   GFRAA >60 12/13/2017 0517   No results found for: CHOL, HDL, LDLCALC, LDLDIRECT, TRIG, CHOLHDL No results found for: HGBA1C No results found for: VITAMINB12 No results found for: TSH   11/14/18 MRI brain [I reviewed images myself and agree with interpretation. -VRP]  - normal    ASSESSMENT AND PLAN  35 y.o. year old female here with:  Meds tried: topiramate  Dx:  1. Tremor   2. Weakness      PLAN:  RIGHT HAND PAIN / TREMOR / WEAKNESS (mild left sided sxs) - post-traumatic pain since 2017; unclear whether truly represents CRPS; continue pain mgmt - check MRI brain, cervical spine to rule out other causes (such as demyelinating disease) - encouraged to start exercise program; may try [employer], classes or personal trainer; vs PT / [place] - continue topiramate 50mg  twice a day  - continue rizatriptan 10mg  as needed for breakthrough headache; may repeat x 1 after 2 hours; max 2 tabs per day or 8 per month  SLEEP PARALYSIS + hypnopompic jerks (only 1 event in Jan 2022) - in setting of COVID infx; not likely seizures; monitor  Meds ordered this encounter  Medications   topiramate (TOPAMAX) 50 MG tablet    Sig: Take 1 tablet (50 mg total) by mouth 2 (two) times daily.    Dispense:  180 tablet    Refill:  4   rizatriptan (MAXALT-MLT) 10 MG disintegrating tablet    Sig: DISSOLVE 1 TABLET ON THE TONGUE AS NEEDED FOR MIGRAINE. MAY REPEAT IN 2 HOURS AS NEEDED    Dispense:  9 tablet    Refill:  11    Return in about 1 year (around 09/21/2022).    Penni Bombard, MD 67/59/1638, 4:66 PM Certified in Neurology, Neurophysiology and Neuroimaging  [facility] [address], [place], [place] [address] [phone]

## 2021-10-10 ENCOUNTER — Ambulatory Visit
Admission: RE | Admit: 2021-10-10 | Discharge: 2021-10-10 | Disposition: A | Discharge status: Home / Self Care | Payer: Managed Care, Other (non HMO) | Source: Ambulatory Visit | Attending: Diagnostic Neuroimaging | Admitting: Diagnostic Neuroimaging

## 2021-10-10 DIAGNOSIS — R531 Weakness: Secondary | ICD-10-CM

## 2021-10-10 DIAGNOSIS — R251 Tremor, unspecified: Secondary | ICD-10-CM

## 2021-10-10 MED ORDER — GADOBENATE DIMEGLUMINE 529 MG/ML IV SOLN
20.0000 mL | Freq: Once | INTRAVENOUS | Status: AC | PRN
  Administered 2021-10-10: 20 mL via INTRAVENOUS

## 2021-10-10 MED ORDER — GADOBENATE DIMEGLUMINE 529 MG/ML IV SOLN: 20.0000 mL | Freq: Once | INTRAVENOUS | Status: DC | PRN

## 2022-09-27 ENCOUNTER — Encounter: Payer: Self-pay | Admitting: Diagnostic Neuroimaging

## 2022-09-27 ENCOUNTER — Ambulatory Visit (INDEPENDENT_AMBULATORY_CARE_PROVIDER_SITE_OTHER): Payer: Managed Care, Other (non HMO) | Admitting: Diagnostic Neuroimaging

## 2022-09-27 VITALS — BP 114/69 | HR 82 | Ht 63.0 in | Wt 215.5 lb

## 2022-09-27 DIAGNOSIS — G43109 Migraine with aura, not intractable, without status migrainosus: Secondary | ICD-10-CM | POA: Diagnosis not present

## 2022-09-27 DIAGNOSIS — R531 Weakness: Secondary | ICD-10-CM

## 2022-09-27 MED ORDER — RIZATRIPTAN BENZOATE 10 MG PO TBDP: ORAL_TABLET | ORAL | 11 refills | Status: AC

## 2022-09-27 MED ORDER — TOPIRAMATE 50 MG PO TABS: 50.0000 mg | ORAL_TABLET | Freq: Two times a day (BID) | ORAL | 4 refills | Status: AC

## 2022-09-27 NOTE — Progress Notes (Signed)
[facility]  PATIENT: Brenda Mitchell DOB: [birth year]  REFERRING CLINICIAN: Glendon Axe, MD  HISTORY FROM: patient REASON FOR VISIT: follow up   HISTORICAL  CHIEF COMPLAINT:  Chief Complaint  Patient presents with   Follow-up    HISTORY OF PRESENT ILLNESS:   UPDATE (09/27/22, VRP): Since last visit, doing well. Symptoms are stable. HA are stable / improved.  Now HA 2 per month.   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 EMG was normal. NM bone scan was normal. Has not been able to exercise that much. HA are stable with rizatriptan.  UPDATE (01/12/21, VRP): Since last visit, doing well, except had event in Jan 2021 while coming out of sleep (3am), but couldn't tell if she was awake or dreaming. Couldn't move initially. Had some headaches. Then she had some twitching movements. Lasted 10-15 minute. Was able to call for help using her Teacher, music. Husband came in and helped her. Then was able to get up. Was diagnosed with COVID a few days later. Now back to baseline.    UPDATE (08/12/20, VRP): Since last visit, doing about the same. Referred by ortho for NCV, to evaluate bilateral upper extremity and right lower extremity pain issues. Symptoms date back to 2016 injury (patient fell and fractured right great toe).   Headaches are stable. Migraine are 2 per month. General headaches are 1 per week. Rizatriptan helping.   UPDATE (02/09/19, VRP): Since last visit, migraine were improved on TPX and rizatriptan. Freq is stable at daily; but severity is improved. Rizatriptan seems to help. However her pain clinic wanted her to try aimovig, but denied by insurance. Also was at ER recently for possible complicated migraine.   PRIOR HPI (11/24/18): 36 year old female here for evaluation of headaches.  2 months ago patient had onset of daily headaches, right frontal, right occipital, sometimes left frontal headaches.   She describes a vice grip sensation without throbbing.  She has nausea, photophobia, blurred vision, sees dark spots.  2016 patient fell down at work and had right hand and right foot injury, developing complex regional pain syndrome.  She is currently on Cymbalta and gabapentin.  Patient also has been having problems with memory loss, confusion, tremors, fatigue, insomnia.  Patient also has depression, anxiety, racing thoughts, suicidal thoughts, disinterest activities, cramps, aching muscles.   REVIEW OF SYSTEMS: Full 14 system review of systems performed and negative with exception of: as per HPI.   ALLERGIES: Allergies  Allergen Reactions   Mushroom Extract Complex Nausea Only    Other reaction(s): Cough, Eye Swelling, Wheezing   Other Other (See Comments)    Grass pollen, yeast, mushroom -cough, itching, eyes burn,water   Yeast-Related Products Other (See Comments)    Eyes water, burn, increased mucus in lungs   Penicillins Swelling and Rash    Has patient had a PCN reaction causing immediate rash, facial/tongue/throat swelling, SOB or lightheadedness with hypotension: Yes Has patient had a PCN reaction causing severe rash involving mucus membranes or skin necrosis: No Has patient had a PCN reaction that required hospitalization: No Has patient had a PCN reaction occurring within the last 10 years: Yes If all of the above answers are "NO", then may proceed with Cephalosporin use.     HOME MEDICATIONS: Outpatient Medications Prior to Visit  Medication Sig Dispense Refill   albuterol (VENTOLIN HFA) 108 (90 Base) MCG/ACT inhaler Inhale 1-2 puffs into the lungs every 4 (four) hours  as needed. For wheezing/shortness of breath.     Cholecalciferol (VITAMIN D3 PO) Take 2 tablets by mouth daily.     Cyanocobalamin (VITAMIN B-12 PO) Take 1,000 mg by mouth daily.     DULoxetine (CYMBALTA) 30 MG capsule Take 60 mg by mouth daily.  3   EPINEPHrine 0.3 mg/0.3 mL IJ SOAJ injection Inject 0.3  mg into the muscle as needed for anaphylaxis.     fluticasone (FLONASE) 50 MCG/ACT nasal spray Place into both nostrils daily.     gabapentin (NEURONTIN) 600 MG tablet Take 600 mg by mouth 3 (three) times daily.     ibuprofen (ADVIL,MOTRIN) 600 MG tablet 1 po  pc   every 6 hours for 5 days then as needed for pain 30 tablet 1   Magnesium 200 MG TABS Take 200 mg by mouth every other day.     Multiple Vitamin (MULTIVITAMIN WITH MINERALS) TABS tablet Take 1 tablet by mouth daily. One-A-Day     omeprazole (PRILOSEC) 40 MG capsule Take 40 mg by mouth daily.     rizatriptan (MAXALT-MLT) 10 MG disintegrating tablet DISSOLVE 1 TABLET ON THE TONGUE AS NEEDED FOR MIGRAINE. MAY REPEAT IN 2 HOURS AS NEEDED 9 tablet 11   topiramate (TOPAMAX) 50 MG tablet Take 1 tablet (50 mg total) by mouth 2 (two) times daily. 180 tablet 4   No facility-administered medications prior to visit.    PAST MEDICAL HISTORY: Past Medical History:  Diagnosis Date   Anemia    Anxiety    Asthma    Complex regional pain syndrome I    CRPS (complex regional pain syndrome type I)    Daytime sleepiness    [place] 14   Depression    Endometriosis    Enlarged thyroid    Erythema nodosum 2000   on legs   Fracture of foot, closed    right   Gastritis    Goiter    Hemorrhoids    internal   Hypercholesterolemia    diet controlled - no meds   Migraine headache    Mononucleosis 2018   Paresthesia    Seasonal allergic rhinitis    Shingles 07/2020    PAST SURGICAL HISTORY: Past Surgical History:  Procedure Laterality Date   BILATERAL SALPINGECTOMY  12/12/2017   CRYOTHERAPY     precancer cells   CYSTOSCOPY N/A 12/12/2017   Procedure: CYSTOSCOPY;  Surgeon: Waymon Amato, MD;  Location: [facility];  Service: Gynecology;  Laterality: N/A;   KNEE SURGERY Right    local and stichtes only   LAPAROSCOPIC ASSISTED VAGINAL HYSTERECTOMY Bilateral 12/12/2017   Procedure: LAPAROSCOPIC ASSISTED VAGINAL HYSTERECTOMY WITH BILATERAL  SALPINGECTOMY AND BIOPSIES OF POSTERIOR CUL DE [place];  Surgeon: Waymon Amato, MD;  Location: [facility];  Service: Gynecology;  Laterality: Bilateral;  3 Hours   MOUTH SURGERY     polyp removed lower left    rectal banding  10/2018   WISDOM TOOTH EXTRACTION      FAMILY HISTORY: Family History  Problem Relation Age of Onset   Other Mother        irregular heart rthyme   Hypertension Mother    Thyroid disease Mother    Stroke Father    Atrial fibrillation Father    Arthritis Maternal Grandmother    Heart attack Maternal Uncle     SOCIAL HISTORY: Social History   Socioeconomic History   Marital status: Legally Separated    Spouse name: Not on file   Number of children: 0  Years of education: Not on file   Highest education level: Associate degree: occupational, technical, or vocational program  Occupational History    Comment: [employer]  Tobacco Use   Smoking status: Former    Packs/day: 0.50    Years: 1.00    Total pack years: 0.50    Types: Cigarettes    Quit date: 12/07/2003    Years since quitting: 18.8   Smokeless tobacco: Never  Vaping Use   Vaping Use: Former   Quit date: 12/07/2015  Substance and Sexual Activity   Alcohol use: Yes    Alcohol/week: 2.0 standard drinks of alcohol    Types: 2 Glasses of wine per week    Comment: occassional   Drug use: No   Sexual activity: Not on file  Other Topics Concern   Not on file  Social History Narrative   Lives with husband   Caffeine- coffee, 6 oz daily   Social Determinants of Health   Financial Resource Strain: Not on file  Food Insecurity: Not on file  Transportation Needs: Not on file  Physical Activity: Not on file  Stress: Not on file  Social Connections: Not on file  Intimate Partner Violence: Not on file     PHYSICAL EXAM  GENERAL EXAM/CONSTITUTIONAL: Vitals:  Vitals:   09/27/22 1544  BP: 114/69  Pulse: 82  Weight: 215 lb 8 oz (97.8 kg)  Height: '5\' 3"'$  (1.6 m)   Body mass index is 38.17 kg/m. Wt  Readings from Last 3 Encounters:  09/27/22 215 lb 8 oz (97.8 kg)  09/21/21 230 lb 9.6 oz (104.6 kg)  08/12/20 245 lb (111.1 kg)   Patient is in no distress; well developed, nourished and groomed; neck is supple  CARDIOVASCULAR: Examination of carotid arteries is normal; no carotid bruits Regular rate and rhythm, no murmurs Examination of peripheral vascular system by observation and palpation is normal  EYES: Ophthalmoscopic exam of optic discs and posterior segments is normal; no papilledema or hemorrhages No results found.  MUSCULOSKELETAL: Gait, strength, tone, movements noted in Neurologic exam below  NEUROLOGIC: MENTAL STATUS:      No data to display         awake, alert, oriented to person, place and time recent and remote memory intact normal attention and concentration language fluent, comprehension intact, naming intact fund of knowledge appropriate  CRANIAL NERVE:  2nd - no papilledema on fundoscopic exam 2nd, 3rd, 4th, 6th - pupils equal and reactive to light, visual fields full to confrontation, extraocular muscles intact, no nystagmus 5th - facial sensation symmetric 7th - facial strength symmetric 8th - hearing intact 9th - palate elevates symmetrically, uvula midline 11th - shoulder shrug symmetric 12th - tongue protrusion midline  MOTOR:  normal bulk and tone, full strength in the BUE, BLE FLUCTUATING WEAKNESS IN BUE AND RLE  SENSORY:  normal and symmetric to light touch, temperature, vibration  COORDINATION:  finger-nose-finger, fine finger movements SLOW  REFLEXES:  deep tendon reflexes SLIGHTLY BRISK IN UPPER EXT and symmetric  GAIT/STATION:  narrow based gait    DIAGNOSTIC DATA (LABS, IMAGING, TESTING) - I reviewed patient records, labs, notes, testing and imaging myself where available.  Lab Results  Component Value Date   WBC 10.9 (H) 12/13/2017   HGB 11.7 (L) 12/13/2017   HCT 34.3 (L) 12/13/2017   MCV 81.7 12/13/2017   PLT  175 12/13/2017      Component Value Date/Time   NA 139 12/13/2017 0517   K 4.1 12/13/2017 0517  CL 107 12/13/2017 0517   CO2 25 12/13/2017 0517   GLUCOSE 96 12/13/2017 0517   BUN 7 12/13/2017 0517   CREATININE 0.76 12/13/2017 0517   CALCIUM 8.2 (L) 12/13/2017 0517   PROT 7.3 01/25/2017 1413   ALBUMIN 4.0 01/25/2017 1413   AST 17 01/25/2017 1413   ALT 18 01/25/2017 1413   ALKPHOS 29 (L) 01/25/2017 1413   BILITOT 0.6 01/25/2017 1413   GFRNONAA >60 12/13/2017 0517   GFRAA >60 12/13/2017 0517   No results found for: "CHOL", "HDL", "LDLCALC", "LDLDIRECT", "TRIG", "CHOLHDL" No results found for: "HGBA1C" No results found for: "VITAMINB12" No results found for: "TSH"   11/14/18 MRI brain [I reviewed images myself and agree with interpretation. -VRP]  - normal  10/10/21 MRI brain - normal  10/10/21 MRI cervical  - normal   ASSESSMENT AND PLAN  36 y.o. year old female here with:  Meds tried: topiramate  Dx:  1. Migraine with aura and without status migrainosus, not intractable   2. Weakness      PLAN:  MIGRAINE WITH AURA - continue topiramate '50mg'$  twice a day  - continue rizatriptan '10mg'$  as needed for breakthrough headache; may repeat x 1 after 2 hours; max 2 tabs per day or 8 per month  RIGHT HAND PAIN / TREMOR / WEAKNESS (mild left sided sxs) - post-traumatic pain since 2017; unclear whether truly represents CRPS; continue pain mgmt - encouraged to start exercise program; may try [employer], classes or personal trainer; vs PT / OT  SLEEP PARALYSIS + hypnopompic jerks (only 1 event in Jan 2022) - in setting of COVID infx; not likely seizures; monitor  Meds ordered this encounter  Medications   rizatriptan (MAXALT-MLT) 10 MG disintegrating tablet    Sig: DISSOLVE 1 TABLET ON THE TONGUE AS NEEDED FOR MIGRAINE. MAY REPEAT IN 2 HOURS AS NEEDED    Dispense:  9 tablet    Refill:  11   topiramate (TOPAMAX) 50 MG tablet    Sig: Take 1 tablet (50 mg total) by mouth 2  (two) times daily.    Dispense:  180 tablet    Refill:  4   Return for pending test results, pending if symptoms worsen or fail to improve.    Penni Bombard, MD 01/05/4387, 8:75 PM Certified in Neurology, Neurophysiology and Neuroimaging  [facility] [address] [place], [place] [address] [phone]

## 2024-09-13 ENCOUNTER — Other Ambulatory Visit (HOSPITAL_BASED_OUTPATIENT_CLINIC_OR_DEPARTMENT_OTHER): Payer: Self-pay

## 2024-09-14 ENCOUNTER — Other Ambulatory Visit (HOSPITAL_BASED_OUTPATIENT_CLINIC_OR_DEPARTMENT_OTHER): Payer: Self-pay

## 2024-09-14 MED ORDER — FAMOTIDINE 20 MG PO TABS
20.0000 mg | ORAL_TABLET | Freq: Two times a day (BID) | ORAL | 1 refills | Status: DC | PRN
  Filled 2024-09-14: qty 60, 30d supply, fill #0
  Filled 2024-10-25: qty 60, 30d supply, fill #1

## 2024-09-14 MED ORDER — OMEPRAZOLE 40 MG PO CPDR
40.0000 mg | DELAYED_RELEASE_CAPSULE | Freq: Every morning | ORAL | 4 refills | Status: AC
  Filled 2024-09-14: qty 90, 90d supply, fill #0
  Filled 2024-12-29: qty 90, 90d supply, fill #1

## 2024-09-14 MED ORDER — TOPIRAMATE 50 MG PO TABS
50.0000 mg | ORAL_TABLET | Freq: Two times a day (BID) | ORAL | 1 refills | Status: AC
  Filled 2024-09-14: qty 180, 90d supply, fill #0
  Filled 2024-10-14: qty 180, 90d supply, fill #1

## 2024-09-14 MED ORDER — GABAPENTIN 600 MG PO TABS: 600.0000 mg | ORAL_TABLET | Freq: Two times a day (BID) | ORAL | 3 refills | Status: AC

## 2024-10-15 ENCOUNTER — Other Ambulatory Visit (HOSPITAL_BASED_OUTPATIENT_CLINIC_OR_DEPARTMENT_OTHER): Payer: Self-pay

## 2024-10-18 ENCOUNTER — Other Ambulatory Visit (HOSPITAL_BASED_OUTPATIENT_CLINIC_OR_DEPARTMENT_OTHER): Payer: Self-pay

## 2024-10-22 ENCOUNTER — Other Ambulatory Visit (HOSPITAL_BASED_OUTPATIENT_CLINIC_OR_DEPARTMENT_OTHER): Payer: Self-pay

## 2024-10-22 MED ORDER — UBRELVY 100 MG PO TABS
ORAL_TABLET | ORAL | 11 refills | Status: AC
  Filled 2024-10-22: qty 16, 30d supply, fill #0

## 2024-10-24 ENCOUNTER — Other Ambulatory Visit (HOSPITAL_BASED_OUTPATIENT_CLINIC_OR_DEPARTMENT_OTHER): Payer: Self-pay

## 2024-10-24 MED ORDER — PREGABALIN 75 MG PO CAPS
75.0000 mg | ORAL_CAPSULE | Freq: Two times a day (BID) | ORAL | 0 refills | Status: AC
  Filled 2024-10-24: qty 60, 30d supply, fill #0

## 2024-10-26 ENCOUNTER — Other Ambulatory Visit (HOSPITAL_BASED_OUTPATIENT_CLINIC_OR_DEPARTMENT_OTHER): Payer: Self-pay

## 2024-10-26 MED ORDER — DULOXETINE HCL 60 MG PO CPEP
60.0000 mg | ORAL_CAPSULE | Freq: Two times a day (BID) | ORAL | 0 refills | Status: AC
  Filled 2024-10-26: qty 180, 90d supply, fill #0

## 2024-10-29 ENCOUNTER — Other Ambulatory Visit (HOSPITAL_BASED_OUTPATIENT_CLINIC_OR_DEPARTMENT_OTHER): Payer: Self-pay

## 2024-11-19 ENCOUNTER — Other Ambulatory Visit (HOSPITAL_BASED_OUTPATIENT_CLINIC_OR_DEPARTMENT_OTHER): Payer: Self-pay

## 2024-11-19 MED ORDER — VRAYLAR 3 MG PO CAPS
3.0000 mg | ORAL_CAPSULE | Freq: Every day | ORAL | 0 refills | Status: AC
  Filled 2024-11-19: qty 90, 90d supply, fill #0

## 2024-11-28 ENCOUNTER — Other Ambulatory Visit (HOSPITAL_BASED_OUTPATIENT_CLINIC_OR_DEPARTMENT_OTHER): Payer: Self-pay

## 2024-11-28 ENCOUNTER — Other Ambulatory Visit: Payer: Self-pay

## 2024-11-28 MED ORDER — GABAPENTIN 600 MG PO TABS
600.0000 mg | ORAL_TABLET | Freq: Three times a day (TID) | ORAL | 0 refills | Status: AC
  Filled 2024-11-28: qty 270, 90d supply, fill #0

## 2024-12-02 ENCOUNTER — Other Ambulatory Visit (HOSPITAL_BASED_OUTPATIENT_CLINIC_OR_DEPARTMENT_OTHER): Payer: Self-pay

## 2024-12-04 ENCOUNTER — Other Ambulatory Visit (HOSPITAL_BASED_OUTPATIENT_CLINIC_OR_DEPARTMENT_OTHER): Payer: Self-pay

## 2024-12-04 MED ORDER — FAMOTIDINE 20 MG PO TABS
20.0000 mg | ORAL_TABLET | Freq: Two times a day (BID) | ORAL | 1 refills | Status: AC
  Filled 2024-12-04: qty 180, 90d supply, fill #0

## 2024-12-19 ENCOUNTER — Other Ambulatory Visit (HOSPITAL_BASED_OUTPATIENT_CLINIC_OR_DEPARTMENT_OTHER): Payer: Self-pay
